# Patient Record
Sex: Male | Born: 1945 | Hispanic: Yes | Marital: Married | Smoking: Former smoker
Health system: Southern US, Community
[De-identification: ages and names within clinical notes are randomized; demographics above are authoritative.]

## PROBLEM LIST (undated history)

## (undated) DIAGNOSIS — E785 Hyperlipidemia, unspecified: Secondary | ICD-10-CM

## (undated) DIAGNOSIS — C801 Malignant (primary) neoplasm, unspecified: Secondary | ICD-10-CM

## (undated) DIAGNOSIS — J189 Pneumonia, unspecified organism: Secondary | ICD-10-CM

## (undated) DIAGNOSIS — R972 Elevated prostate specific antigen [PSA]: Secondary | ICD-10-CM

## (undated) DIAGNOSIS — I10 Essential (primary) hypertension: Secondary | ICD-10-CM

## (undated) HISTORY — PX: PROSTATE BIOPSY: SHX241

## (undated) HISTORY — PX: HERNIA REPAIR: SHX51

## (undated) HISTORY — DX: Hyperlipidemia, unspecified: E78.5

## (undated) HISTORY — DX: Elevated prostate specific antigen (PSA): R97.20

## (undated) HISTORY — DX: Essential (primary) hypertension: I10

---

## 2006-04-21 DIAGNOSIS — I10 Essential (primary) hypertension: Secondary | ICD-10-CM

## 2006-04-21 HISTORY — DX: Essential (primary) hypertension: I10

## 2007-01-18 ENCOUNTER — Ambulatory Visit: Payer: Self-pay | Admitting: Internal Medicine

## 2007-01-18 LAB — CONVERTED CEMR LAB
ALT: 31 units/L (ref 0–53)
AST: 18 units/L (ref 0–37)
Alkaline Phosphatase: 74 units/L (ref 39–117)
Basophils Absolute: 0 10*3/uL (ref 0.0–0.1)
Basophils Relative: 0 % (ref 0–1)
Chloride: 104 meq/L (ref 96–112)
Creatinine, Ser: 0.99 mg/dL (ref 0.40–1.50)
Eosinophils Absolute: 0.2 10*3/uL (ref 0.0–0.7)
Eosinophils Relative: 3 % (ref 0–5)
Hemoglobin: 16.3 g/dL (ref 13.0–17.0)
MCHC: 33 g/dL (ref 30.0–36.0)
Monocytes Absolute: 0.6 10*3/uL (ref 0.2–0.7)
Neutro Abs: 4.9 10*3/uL (ref 1.7–7.7)
PSA: 2.86 ng/mL (ref 0.10–4.00)
RDW: 14.1 % — ABNORMAL HIGH (ref 11.5–14.0)
Total Bilirubin: 0.6 mg/dL (ref 0.3–1.2)
Total CHOL/HDL Ratio: 4.2
VLDL: 48 mg/dL — ABNORMAL HIGH (ref 0–40)

## 2007-02-15 ENCOUNTER — Ambulatory Visit: Payer: Self-pay | Admitting: Internal Medicine

## 2007-02-16 ENCOUNTER — Ambulatory Visit: Payer: Self-pay | Admitting: *Deleted

## 2007-04-05 ENCOUNTER — Ambulatory Visit: Payer: Self-pay | Admitting: Family Medicine

## 2007-04-05 ENCOUNTER — Encounter (INDEPENDENT_AMBULATORY_CARE_PROVIDER_SITE_OTHER): Payer: Self-pay | Admitting: Internal Medicine

## 2007-04-05 LAB — CONVERTED CEMR LAB
ALT: 22 units/L (ref 0–53)
Alkaline Phosphatase: 74 units/L (ref 39–117)
Creatinine, Ser: 0.96 mg/dL (ref 0.40–1.50)
LDL Cholesterol: 116 mg/dL — ABNORMAL HIGH (ref 0–99)
Sodium: 141 meq/L (ref 135–145)
Total Bilirubin: 0.9 mg/dL (ref 0.3–1.2)
Total CHOL/HDL Ratio: 4.1
Total Protein: 7.2 g/dL (ref 6.0–8.3)
Triglycerides: 138 mg/dL (ref <150)
VLDL: 28 mg/dL (ref 0–40)

## 2007-04-12 ENCOUNTER — Ambulatory Visit: Payer: Self-pay | Admitting: Family Medicine

## 2016-04-21 DIAGNOSIS — E785 Hyperlipidemia, unspecified: Secondary | ICD-10-CM

## 2016-04-21 HISTORY — DX: Hyperlipidemia, unspecified: E78.5

## 2017-02-05 ENCOUNTER — Encounter: Payer: Self-pay | Admitting: Pediatric Intensive Care

## 2017-02-27 NOTE — Congregational Nurse Program (Signed)
Congregational Nurse Program Note  Date of Encounter: 02/05/2017  Past Medical History: No past medical history on file.  Encounter Details: CNP Questionnaire - 02/05/17 1600      Questionnaire   Patient Status  Immigrant    Race  Hispanic or Latino    Location Patient Served At  [facility]  Not Applicable    Uninsured  Uninsured (NEW 1x/quarter)    Food  No food insecurities    Housing/Utilities  Yes, have permanent housing    Transportation  Yes, need transportation assistance    Interpersonal Safety  Yes, feel physically and emotionally safe where you currently live    Medication  No medication insecurities    Medical Provider  No    Referrals  Primary Care Provider/Clinic;Other;Medication Assistance    ED Visit Averted  Yes    Life-Saving Intervention Made  Not Applicable      Via interpreter [name]- client states history of hypertension and took medication several years ago but has not been to a medical provider in years because he states he feels good. Denies headache or blurred vision. CN notified medical director Tia Masker MD. CN obtained Prinivil 20mg /12.5mg  qday for client and explained how to take medication. Client states understanding. Client will follow up in CN clinic next week for BP re-check. CN will make appointment at [facility] clinic for client.

## 2017-03-10 ENCOUNTER — Encounter: Payer: Self-pay | Admitting: Pediatric Intensive Care

## 2017-03-10 NOTE — Congregational Nurse Program (Signed)
Congregational Nurse Program Note  Date of Encounter: 03/10/2017  Past Medical History: No past medical history on file.  Encounter Details: CNP Questionnaire - 03/10/17 1430      Questionnaire   Patient Status  Immigrant    Race  Hispanic or Latino    Location Patient Served At  [facility]  Not Applicable    Uninsured  Uninsured (Subsequent visits/quarter)    Food  No food insecurities    Housing/Utilities  Yes, have permanent housing    Transportation  Within past 12 months, lack of transportation negatively impacted life;Yes, need transportation assistance    Interpersonal Safety  Yes, feel physically and emotionally safe where you currently live    Medication  Yes, have medication insecurities;Provided medication assistance    Medical Provider  No    Referrals  Primary Care Provider/Clinic;Medication Assistance    ED Visit Averted  Not Applicable    Life-Saving Intervention Made  Not Applicable      BP follow up. Via interpreter Beverlee Nims, client states that he feels much better since he started taking medication again. He takes his medication daily with breakfast. He does not add extra salt to foods and says his daughter (who cooks meals) does not heavily salt food. CN stated that she has not been able to find a PCP appointment but should will call [facility] clinic on Monday. Client given refill for Prinivil. Client will return to clinic for BP check.

## 2017-03-24 NOTE — Congregational Nurse Program (Signed)
Congregational Nurse Program Note  Date of Encounter: 03/10/2017  Past Medical History: No past medical history on file.  Encounter Details: CNP Questionnaire - 03/10/17 1645      Questionnaire   Patient Status  Immigrant    Race  Hispanic or Latino    Location Patient Served At  [facility]  Not Applicable    Uninsured  Uninsured (Subsequent visits/quarter)    Food  No food insecurities    Housing/Utilities  Yes, have permanent housing    Transportation  Within past 12 months, lack of transportation negatively impacted life    Interpersonal Safety  Yes, feel physically and emotionally safe where you currently live    Medication  Yes, have medication insecurities    Medical Provider  No    Referrals  Primary Care Provider/Clinic    ED Visit Averted  Not Applicable    Life-Saving Intervention Made  Not Applicable      Call to client to discuss PCp appointment at [facility] on 11/8.

## 2017-04-28 ENCOUNTER — Encounter: Payer: Self-pay | Admitting: Internal Medicine

## 2017-04-28 ENCOUNTER — Ambulatory Visit: Payer: Self-pay | Admitting: Internal Medicine

## 2017-04-28 VITALS — BP 170/90 | HR 82 | Resp 14 | Ht 66.25 in | Wt 187.0 lb

## 2017-04-28 DIAGNOSIS — K029 Dental caries, unspecified: Secondary | ICD-10-CM

## 2017-04-28 DIAGNOSIS — I10 Essential (primary) hypertension: Secondary | ICD-10-CM

## 2017-04-28 DIAGNOSIS — E663 Overweight: Secondary | ICD-10-CM | POA: Insufficient documentation

## 2017-04-28 MED ORDER — LISINOPRIL-HYDROCHLOROTHIAZIDE 20-12.5 MG PO TABS: 1.0000 | ORAL_TABLET | Freq: Every day | ORAL | 11 refills | Status: DC

## 2017-04-28 NOTE — Progress Notes (Signed)
LCSW completed new patient screening with pt. Pt reported that he had almost no stress or current stressors. He was accompanied by his daughter, who he lives with. They reported that they do not have issues with any social determinants of health. LCSW scheduled pt for an [employer].

## 2017-04-28 NOTE — Patient Instructions (Signed)

## 2017-04-28 NOTE — Progress Notes (Signed)
   Subjective:    Patient ID: [name], male    DOB: 02-14-1946, 72 y.o.   MRN: [id number]  HPI   Here to establish  1.  Essential Hypertension:  Has had diagnosis for more than 10 years.  Has been well controlled with Lisinopril HCTZ 20/12.5 mg daily. Has been out of med for 2-3 months. About 10 years ago, he was followed at [facility].  Sounds like he was without medication for 9 years until obtained with Congregational Nurse at [employer]. No labs done in past 10 years. He walks 30 minutes daily.    Diet:    Breakfast:  Coffee with milk.  Cheese sandwich with cheese  Lunch: rice and beans.  Some sort of meat.  Possibly with vegetables.  Dinner:  Banana or slice of white bread.  Drinks mainly water throughout the day.  Fruit juice.  1 soda per week.    No outpatient medications have been marked as taking for the 04/28/17 encounter (Office Visit) with Mack Hook, MD.    No Known Allergies   Past Medical History:  Diagnosis Date  . Hypertension 2008    History reviewed. No pertinent surgical history.   Family History  Problem Relation Age of Onset  . Alcohol abuse Brother   . Cirrhosis Brother        cause of death    Social History   Socioeconomic History  . Marital status: Divorced    Spouse name: Not on file  . Number of children: 3  . Years of education: 22 + 1 year college  . Highest education level: Not on file  Social Needs  . Financial resource strain: Not on file  . Food insecurity - worry: Never true  . Food insecurity - inability: Never true  . Transportation needs - medical: No  . Transportation needs - non-medical: No  Occupational History  . Not on file  Tobacco Use  . Smoking status: Never Smoker  . Smokeless tobacco: Never Used  Substance and Sexual Activity  . Alcohol use: Yes    Alcohol/week: 0.6 oz    Types: 1 Cans of beer per week    Comment: occasionally  . Drug use: No  . Sexual activity: Not on file  Other  Topics Concern  . Not on file  Social History Narrative  . Not on file    Review of Systems     Objective:   Physical Exam  NAD HEENT: PERRL, EOMI, Discs sharp, TMs pearly gray, throat without injection.  Gingiva receding significantly with periodontal disease.  + dental decay. Neck:  Supple, No adenopathy, no thyromegaly Chest:  CTA CV:  RRR with normal S1 and S2, No S3, S4 or murmur.  No carotid bruits.  Carotid, radial , DP pulse normal and equal Abd:  S, NT, No HSM or mass, + BS LE:  No edema.        Assessment & Plan:  1.  Essential Hypertension:  Refill Lisinopril/HCTZ.  Labs in 1 week  2.  Overweight:  Fasting labs in next week:  FLP, CMP, CBC.  Discussed at length lifestyle changes for diet and physical activity.  3.  Possible left inguinal hernia:  Follow up with CPE.  Patient brings this up at end of visit. Has had for years.  4.  Dental Decay:  Dental referral when he obtains orange card.

## 2017-05-05 ENCOUNTER — Other Ambulatory Visit: Payer: Self-pay

## 2017-05-05 DIAGNOSIS — E663 Overweight: Secondary | ICD-10-CM

## 2017-05-05 DIAGNOSIS — Z13220 Encounter for screening for lipoid disorders: Secondary | ICD-10-CM

## 2017-05-05 DIAGNOSIS — Z23 Encounter for immunization: Secondary | ICD-10-CM

## 2017-05-06 LAB — CBC WITH DIFFERENTIAL/PLATELET
BASOS ABS: 0 10*3/uL (ref 0.0–0.2)
Basos: 0 %
EOS (ABSOLUTE): 0.3 10*3/uL (ref 0.0–0.4)
EOS: 4 %
HEMATOCRIT: 45.8 % (ref 37.5–51.0)
HEMOGLOBIN: 16.2 g/dL (ref 13.0–17.7)
Immature Grans (Abs): 0 10*3/uL (ref 0.0–0.1)
Immature Granulocytes: 0 %
LYMPHS ABS: 1.4 10*3/uL (ref 0.7–3.1)
Lymphs: 23 %
MCH: 30.6 pg (ref 26.6–33.0)
MCHC: 35.4 g/dL (ref 31.5–35.7)
MCV: 86 fL (ref 79–97)
MONOS ABS: 0.5 10*3/uL (ref 0.1–0.9)
Monocytes: 9 %
NEUTROS ABS: 3.9 10*3/uL (ref 1.4–7.0)
Neutrophils: 64 %
Platelets: 250 10*3/uL (ref 150–379)
RBC: 5.3 x10E6/uL (ref 4.14–5.80)
RDW: 14.4 % (ref 12.3–15.4)
WBC: 6.1 10*3/uL (ref 3.4–10.8)

## 2017-05-06 LAB — COMPREHENSIVE METABOLIC PANEL
ALK PHOS: 83 IU/L (ref 39–117)
ALT: 26 IU/L (ref 0–44)
AST: 17 IU/L (ref 0–40)
Albumin/Globulin Ratio: 1.9 (ref 1.2–2.2)
Albumin: 4.7 g/dL (ref 3.6–4.8)
BILIRUBIN TOTAL: 0.7 mg/dL (ref 0.0–1.2)
BUN/Creatinine Ratio: 13 (ref 10–24)
BUN: 13 mg/dL (ref 8–27)
CHLORIDE: 103 mmol/L (ref 96–106)
CO2: 24 mmol/L (ref 20–29)
Calcium: 9.9 mg/dL (ref 8.6–10.2)
Creatinine, Ser: 1 mg/dL (ref 0.76–1.27)
GFR calc Af Amer: 88 mL/min/{1.73_m2} (ref >59)
GFR calc non Af Amer: 76 mL/min/{1.73_m2} (ref >59)
Globulin, Total: 2.5 g/dL (ref 1.5–4.5)
Glucose: 95 mg/dL (ref 65–99)
POTASSIUM: 4.5 mmol/L (ref 3.5–5.2)
SODIUM: 143 mmol/L (ref 134–144)
Total Protein: 7.2 g/dL (ref 6.0–8.5)

## 2017-05-06 LAB — LIPID PANEL W/O CHOL/HDL RATIO
CHOLESTEROL TOTAL: 192 mg/dL (ref 100–199)
HDL: 43 mg/dL (ref >39)
LDL Calculated: 127 mg/dL — ABNORMAL HIGH (ref 0–99)
TRIGLYCERIDES: 109 mg/dL (ref 0–149)
VLDL Cholesterol Cal: 22 mg/dL (ref 5–40)

## 2017-05-07 ENCOUNTER — Telehealth: Payer: Self-pay | Admitting: Internal Medicine

## 2017-05-07 NOTE — Telephone Encounter (Signed)
Antony Madura called patient and LVM for patient to call back

## 2017-05-07 NOTE — Telephone Encounter (Signed)
Awesome!

## 2017-05-07 NOTE — Telephone Encounter (Signed)
Notify patient his blood cell counts are normal, kidney and liver function normal His glucose was fine. His cholesterol total is high normal and his good portion is a bit low while the bad portion is a bit high. Needs to work on diet and physical activity as we discussed to get his cholesterol panel in a good way.

## 2017-05-07 NOTE — Telephone Encounter (Signed)
Patient returned call and information was given on 05/07/17. Patient verbally agreed and stated is going to run two times a week with his Grandson; and is eating more vegetables and fruits.  Patient also stated is avoiding fried food as well.

## 2017-06-29 ENCOUNTER — Encounter: Payer: Self-pay | Admitting: Internal Medicine

## 2017-06-29 ENCOUNTER — Ambulatory Visit: Payer: Self-pay | Admitting: Internal Medicine

## 2017-06-29 VITALS — BP 122/82 | HR 62 | Resp 12 | Ht 66.25 in | Wt 185.0 lb

## 2017-06-29 DIAGNOSIS — E663 Overweight: Secondary | ICD-10-CM

## 2017-06-29 DIAGNOSIS — K029 Dental caries, unspecified: Secondary | ICD-10-CM

## 2017-06-29 DIAGNOSIS — T464X5A Adverse effect of angiotensin-converting-enzyme inhibitors, initial encounter: Secondary | ICD-10-CM

## 2017-06-29 DIAGNOSIS — K4090 Unilateral inguinal hernia, without obstruction or gangrene, not specified as recurrent: Secondary | ICD-10-CM | POA: Insufficient documentation

## 2017-06-29 DIAGNOSIS — R05 Cough: Secondary | ICD-10-CM

## 2017-06-29 DIAGNOSIS — Z0000 Encounter for general adult medical examination without abnormal findings: Secondary | ICD-10-CM

## 2017-06-29 DIAGNOSIS — B352 Tinea manuum: Secondary | ICD-10-CM

## 2017-06-29 DIAGNOSIS — I10 Essential (primary) hypertension: Secondary | ICD-10-CM

## 2017-06-29 DIAGNOSIS — E785 Hyperlipidemia, unspecified: Secondary | ICD-10-CM

## 2017-06-29 DIAGNOSIS — B353 Tinea pedis: Secondary | ICD-10-CM

## 2017-06-29 DIAGNOSIS — Z125 Encounter for screening for malignant neoplasm of prostate: Secondary | ICD-10-CM

## 2017-06-29 MED ORDER — LOSARTAN POTASSIUM-HCTZ 50-12.5 MG PO TABS: 1.0000 | ORAL_TABLET | Freq: Every day | ORAL | 11 refills | Status: DC

## 2017-06-29 NOTE — Progress Notes (Signed)
Subjective:    Patient ID: [name], male    DOB: 1945-10-14, 72 y.o.   MRN: [id number]  HPI   Interpreted  Here for Male CPE:  1.  STE:  Does not perform.  No family history of testicular cancer.  2.  PSA/DRE: PSA tested 10 years ago and normal.  DRE also performed 10 years ago as well, also normal.  No family history of prostate cancer.  3.  Guaiac Cards:  Never.  No family history of colon cancer  4.  Colonoscopy:  Never.  As above.  5.  Cholesterol/Glucose:  Cholesterol high normal with mildly low HDL in January. Lipid Panel     Component Value Date/Time   CHOL 192 05/05/2017 0915   TRIG 109 05/05/2017 0915   HDL 43 05/05/2017 0915   CHOLHDL 4.1 Ratio 04/05/2007 2037   VLDL 28 04/05/2007 2037   LDLCALC 127 (H) 05/05/2017 0915   Fasting glucose in January normal at 95.  6.  Immunizations:  Did not get influenza vaccine this year. Immunization History  Administered Date(s) Administered  . Pneumococcal Polysaccharide-23 05/05/2017  . Tdap 05/05/2017   7.  Essential Hypertension:  Has had a cough at night since restarting Lisinopril/HCTZ.  Dry cough.  BP is well controlled, however.  Current Meds  Medication Sig  . lisinopril-hydrochlorothiazide (PRINZIDE,ZESTORETIC) 20-12.5 MG tablet Take 1 tablet by mouth daily.    No Known Allergies   Past Medical History:  Diagnosis Date  . Dyslipidemia 2018  . Hypertension 2008    History reviewed. No pertinent surgical history.   Family History  Problem Relation Age of Onset  . Hypertension Sister   . Hypertension Brother   . Hypertension Sister   . Hypertension Sister   . Stroke Brother   . Hypertension Brother   . Alcohol abuse Brother   . Cirrhosis Brother        Cause of death  . Hypertension Brother   . Hypertension Sister   . Hypertension Brother     Social History   Socioeconomic History  . Marital status: Divorced    Spouse name: Not on file  . Number of children: 3  . Years of  education: 59 + 1 year college  . Highest education level: Not on file  Social Needs  . Financial resource strain: Not on file  . Food insecurity - worry: Never true  . Food insecurity - inability: Never true  . Transportation needs - medical: No  . Transportation needs - non-medical: No  Occupational History  . Occupation: Maintenance/custodial work in past  Tobacco Use  . Smoking status: Never Smoker  . Smokeless tobacco: Never Used  Substance and Sexual Activity  . Alcohol use: Yes    Alcohol/week: 0.6 oz    Types: 1 Cans of beer per week    Comment: occasionally  . Drug use: No  . Sexual activity: Not on file  Other Topics Concern  . Not on file  Social History Narrative   From [place]   Lives with daughter, Michelene Heady and family    Review of Systems  Constitutional: Negative for appetite change, fever and unexpected weight change.  HENT: Positive for dental problem (has a hole in one of his teeth.  Would like dental referral). Negative for ear pain, hearing loss, sinus pain and sore throat.   Eyes: Positive for visual disturbance (bifocals.).  Respiratory: Positive for cough (dry--since restarting ACE I).   Cardiovascular: Negative for chest pain, palpitations and  leg swelling.  Gastrointestinal: Negative for abdominal pain, blood in stool, constipation and diarrhea.  Genitourinary: Negative for decreased urine volume, dysuria and frequency.       Has what he believes is a hernia in the right groin area.  Has had for 1 year.  Has enlarged with time.  He feels is still reducible.  Musculoskeletal: Negative for arthralgias.  Skin: Negative for rash.  Neurological: Negative for seizures, weakness and numbness.  Psychiatric/Behavioral: Negative for dysphoric mood.       Objective:   Physical Exam  Constitutional: He is oriented to person, place, and time. He appears well-developed and well-nourished.  HENT:  Head: Normocephalic and atraumatic.  Right Ear: Hearing, tympanic  membrane, external ear and ear canal normal.  Left Ear: Hearing, tympanic membrane, external ear and ear canal normal.  Nose: Nose normal.  Mouth/Throat: Oropharynx is clear and moist and mucous membranes are normal. Dental caries (Significant decay, gingival recession) present.  Eyes: Conjunctivae and EOM are normal. Pupils are equal, round, and reactive to light.  Discs sharp  Neck: Normal range of motion and full passive range of motion without pain. Neck supple. No thyromegaly present.  Cardiovascular: Normal rate, regular rhythm, S1 normal and S2 normal. Exam reveals no S3, no S4 and no friction rub.  No murmur heard. No carotid bruits.  Carotid, radial, femoral, DP and PT pulses normal and equal.   Pulmonary/Chest: Effort normal and breath sounds normal.  Abdominal: Soft. Bowel sounds are normal. He exhibits no mass. There is no hepatosplenomegaly. There is no tenderness. A hernia is present. Hernia confirmed positive in the left inguinal area (Large opening in left groin.  Easily reducible).  Genitourinary: Prostate normal and penis normal. Rectal exam shows no mass, no tenderness and guaiac negative stool. Right testis shows no mass, no swelling and no tenderness. Right testis is descended. Left testis shows no mass, no swelling and no tenderness. Left testis is descended. No penile tenderness.  Musculoskeletal: Normal range of motion. He exhibits no edema.  Lymphadenopathy:       Head (right side): No submental and no submandibular adenopathy present.       Head (left side): No submental and no submandibular adenopathy present.    He has no cervical adenopathy.    He has no axillary adenopathy.       Right: No inguinal and no supraclavicular adenopathy present.       Left: No inguinal and no supraclavicular adenopathy present.  Neurological: He is alert and oriented to person, place, and time. He has normal strength and normal reflexes. No cranial nerve deficit or sensory deficit.  Coordination and gait normal.  Skin: Skin is warm and dry. Lesion (1 cm smooth pedunculated skin colored lesion on thin neck lateral to left knee.) and rash (flaking and fissuring of right hand, palmar aspect with involvment of interdigital spaces of ulnar digits.  Similar involvment of both feet.) noted.  Psychiatric: He has a normal mood and affect. His speech is normal and behavior is normal. Judgment and thought content normal. Cognition and memory are normal.          Assessment & Plan:  1.  CPE:   PSA Guaiac cards, to return in 2 weeks Had rest of labs performed in January.  2.  Overweight:  Encouraged lifestyle changes with both diet and physical activity.  3.  Essential Hypertension with ACE I cough:  Switch to Losartan/HCTZ 50/12.5 mg daily.  BP check in 1 month to  be certain controlled on new medication without cough.  4.  Left Inguinal Hernia:  Gen Surgery vs Urology referral--wherever he can get in first.  [place]  5.  Dental Decay:  Another attempt to get him into dental clinic.  Currently a delay.  6.  Tinea manus (R), and bilateral tinea pedis:  Topical Terbinafine twice daily for at least 14 days for all areas, or until resolves. Tea Tree oil mixed with [place] foot cream twice daily as well to feet/callusing.

## 2017-06-30 LAB — PSA: Prostate Specific Ag, Serum: 6.7 ng/mL — ABNORMAL HIGH (ref 0.0–4.0)

## 2017-07-06 ENCOUNTER — Other Ambulatory Visit: Payer: Self-pay

## 2017-07-06 ENCOUNTER — Other Ambulatory Visit: Payer: Self-pay | Admitting: Internal Medicine

## 2017-07-06 DIAGNOSIS — K4090 Unilateral inguinal hernia, without obstruction or gangrene, not specified as recurrent: Secondary | ICD-10-CM

## 2017-07-06 DIAGNOSIS — R972 Elevated prostate specific antigen [PSA]: Secondary | ICD-10-CM

## 2017-07-06 NOTE — Progress Notes (Signed)
Switching to Urology for evaluation for hernia repair as needs urologic work up for elevated PSA

## 2017-07-14 ENCOUNTER — Other Ambulatory Visit (INDEPENDENT_AMBULATORY_CARE_PROVIDER_SITE_OTHER): Payer: Self-pay | Admitting: Internal Medicine

## 2017-07-14 DIAGNOSIS — Z1211 Encounter for screening for malignant neoplasm of colon: Secondary | ICD-10-CM

## 2017-07-14 LAB — POC HEMOCCULT BLD/STL (HOME/3-CARD/SCREEN)
FECAL OCCULT BLD: NEGATIVE
FECAL OCCULT BLD: NEGATIVE
FECAL OCCULT BLD: NEGATIVE

## 2017-07-27 ENCOUNTER — Other Ambulatory Visit: Payer: Self-pay

## 2017-07-27 VITALS — BP 112/78 | HR 72

## 2017-07-27 DIAGNOSIS — I10 Essential (primary) hypertension: Secondary | ICD-10-CM

## 2017-07-27 DIAGNOSIS — Z79899 Other long term (current) drug therapy: Secondary | ICD-10-CM

## 2017-07-27 NOTE — Progress Notes (Signed)
Patient BP in normal range. Informed patient to continue BP medication. Patient verbalized understanding.

## 2017-07-28 LAB — BASIC METABOLIC PANEL
BUN/Creatinine Ratio: 11 (ref 10–24)
BUN: 12 mg/dL (ref 8–27)
CO2: 27 mmol/L (ref 20–29)
Calcium: 10.3 mg/dL — ABNORMAL HIGH (ref 8.6–10.2)
Chloride: 103 mmol/L (ref 96–106)
Creatinine, Ser: 1.07 mg/dL (ref 0.76–1.27)
GFR calc Af Amer: 81 mL/min/{1.73_m2} (ref >59)
GFR calc non Af Amer: 70 mL/min/{1.73_m2} (ref >59)
Glucose: 101 mg/dL — ABNORMAL HIGH (ref 65–99)
Potassium: 4.7 mmol/L (ref 3.5–5.2)
Sodium: 145 mmol/L — ABNORMAL HIGH (ref 134–144)

## 2017-11-14 ENCOUNTER — Encounter (HOSPITAL_COMMUNITY): Payer: Self-pay | Admitting: *Deleted

## 2017-11-14 ENCOUNTER — Ambulatory Visit: Payer: Self-pay | Admitting: General Surgery

## 2017-11-14 NOTE — Progress Notes (Signed)
Denies chest pain or shortness of breath. Denies cardiologist visit. [place] interpreter number [phone] used for preop interview.

## 2017-11-17 ENCOUNTER — Encounter (HOSPITAL_COMMUNITY)
Admission: RE | Disposition: A | Discharge status: Home / Self Care | Payer: Self-pay | Source: Ambulatory Visit | Attending: General Surgery

## 2017-11-17 ENCOUNTER — Ambulatory Visit (HOSPITAL_COMMUNITY): Payer: Self-pay | Admitting: Certified Registered Nurse Anesthetist

## 2017-11-17 ENCOUNTER — Encounter (HOSPITAL_COMMUNITY): Payer: Self-pay

## 2017-11-17 ENCOUNTER — Ambulatory Visit (HOSPITAL_COMMUNITY)
Admission: RE | Admit: 2017-11-17 | Discharge: 2017-11-17 | Disposition: A | Discharge status: Home / Self Care | Payer: Self-pay | Source: Ambulatory Visit | Attending: General Surgery | Admitting: General Surgery

## 2017-11-17 DIAGNOSIS — K4020 Bilateral inguinal hernia, without obstruction or gangrene, not specified as recurrent: Secondary | ICD-10-CM | POA: Insufficient documentation

## 2017-11-17 DIAGNOSIS — Z87891 Personal history of nicotine dependence: Secondary | ICD-10-CM | POA: Insufficient documentation

## 2017-11-17 DIAGNOSIS — I10 Essential (primary) hypertension: Secondary | ICD-10-CM | POA: Insufficient documentation

## 2017-11-17 DIAGNOSIS — Z79899 Other long term (current) drug therapy: Secondary | ICD-10-CM | POA: Insufficient documentation

## 2017-11-17 HISTORY — PX: INSERTION OF MESH: SHX5868

## 2017-11-17 HISTORY — PX: INGUINAL HERNIA REPAIR: SHX194

## 2017-11-17 LAB — BASIC METABOLIC PANEL
Anion gap: 7 (ref 5–15)
BUN: 9 mg/dL (ref 8–23)
CALCIUM: 9.1 mg/dL (ref 8.9–10.3)
CO2: 25 mmol/L (ref 22–32)
Chloride: 108 mmol/L (ref 98–111)
Creatinine, Ser: 1.04 mg/dL (ref 0.61–1.24)
GFR calc Af Amer: >60 mL/min (ref >60)
GLUCOSE: 100 mg/dL — AB (ref 70–99)
POTASSIUM: 3.9 mmol/L (ref 3.5–5.1)
SODIUM: 140 mmol/L (ref 135–145)

## 2017-11-17 LAB — CBC
HCT: 46.7 % (ref 39.0–52.0)
Hemoglobin: 15.1 g/dL (ref 13.0–17.0)
MCH: 29.2 pg (ref 26.0–34.0)
MCHC: 32.3 g/dL (ref 30.0–36.0)
MCV: 90.3 fL (ref 78.0–100.0)
PLATELETS: 238 10*3/uL (ref 150–400)
RBC: 5.17 MIL/uL (ref 4.22–5.81)
RDW: 13.5 % (ref 11.5–15.5)
WBC: 5.8 10*3/uL (ref 4.0–10.5)

## 2017-11-17 SURGERY — REPAIR, HERNIA, INGUINAL, BILATERAL, LAPAROSCOPIC
Anesthesia: General | Laterality: Bilateral

## 2017-11-17 MED ORDER — ESMOLOL HCL 100 MG/10ML IV SOLN
INTRAVENOUS | Status: DC | PRN
  Administered 2017-11-17 (×3): 20 mg via INTRAVENOUS

## 2017-11-17 MED ORDER — ACETAMINOPHEN 160 MG/5ML PO SOLN: 325.0000 mg | ORAL | Status: DC | PRN

## 2017-11-17 MED ORDER — 0.9 % SODIUM CHLORIDE (POUR BTL) OPTIME
TOPICAL | Status: DC | PRN
  Administered 2017-11-17: 1000 mL

## 2017-11-17 MED ORDER — GABAPENTIN 300 MG PO CAPS
300.0000 mg | ORAL_CAPSULE | ORAL | Status: AC
  Administered 2017-11-17: 300 mg via ORAL

## 2017-11-17 MED ORDER — CELECOXIB 200 MG PO CAPS
ORAL_CAPSULE | ORAL | Status: AC
  Administered 2017-11-17: 200 mg via ORAL
  Filled 2017-11-17: qty 1

## 2017-11-17 MED ORDER — EPHEDRINE SULFATE 50 MG/ML IJ SOLN
INTRAMUSCULAR | Status: DC | PRN
  Administered 2017-11-17: 10 mg via INTRAVENOUS

## 2017-11-17 MED ORDER — OXYCODONE HCL 5 MG PO TABS: 5.0000 mg | ORAL_TABLET | Freq: Once | ORAL | Status: DC | PRN

## 2017-11-17 MED ORDER — CHLORHEXIDINE GLUCONATE CLOTH 2 % EX PADS: 6.0000 | MEDICATED_PAD | Freq: Once | CUTANEOUS | Status: DC

## 2017-11-17 MED ORDER — FENTANYL CITRATE (PF) 250 MCG/5ML IJ SOLN
INTRAMUSCULAR | Status: DC | PRN
  Administered 2017-11-17 (×5): 50 ug via INTRAVENOUS

## 2017-11-17 MED ORDER — MEPERIDINE HCL 50 MG/ML IJ SOLN: 6.2500 mg | INTRAMUSCULAR | Status: DC | PRN

## 2017-11-17 MED ORDER — DEXAMETHASONE SODIUM PHOSPHATE 10 MG/ML IJ SOLN
INTRAMUSCULAR | Status: AC
  Filled 2017-11-17: qty 1

## 2017-11-17 MED ORDER — DEXAMETHASONE SODIUM PHOSPHATE 10 MG/ML IJ SOLN
INTRAMUSCULAR | Status: DC | PRN
  Administered 2017-11-17: 5 mg via INTRAVENOUS

## 2017-11-17 MED ORDER — FENTANYL CITRATE (PF) 100 MCG/2ML IJ SOLN
INTRAMUSCULAR | Status: AC
  Filled 2017-11-17: qty 2

## 2017-11-17 MED ORDER — CEFAZOLIN SODIUM-DEXTROSE 2-4 GM/100ML-% IV SOLN
INTRAVENOUS | Status: AC
  Filled 2017-11-17: qty 100

## 2017-11-17 MED ORDER — HYDRALAZINE HCL 20 MG/ML IJ SOLN
10.0000 mg | Freq: Once | INTRAMUSCULAR | Status: AC
  Administered 2017-11-17: 10 mg via INTRAVENOUS

## 2017-11-17 MED ORDER — ONDANSETRON HCL 4 MG/2ML IJ SOLN: 4.0000 mg | Freq: Once | INTRAMUSCULAR | Status: DC | PRN

## 2017-11-17 MED ORDER — ACETAMINOPHEN 500 MG PO TABS
1000.0000 mg | ORAL_TABLET | ORAL | Status: AC
  Administered 2017-11-17: 1000 mg via ORAL

## 2017-11-17 MED ORDER — LIDOCAINE 2% (20 MG/ML) 5 ML SYRINGE
INTRAMUSCULAR | Status: AC
  Filled 2017-11-17: qty 10

## 2017-11-17 MED ORDER — FENTANYL CITRATE (PF) 100 MCG/2ML IJ SOLN: 25.0000 ug | INTRAMUSCULAR | Status: DC | PRN

## 2017-11-17 MED ORDER — ACETAMINOPHEN 500 MG PO TABS
ORAL_TABLET | ORAL | Status: AC
  Administered 2017-11-17: 1000 mg via ORAL
  Filled 2017-11-17: qty 2

## 2017-11-17 MED ORDER — MIDAZOLAM HCL 2 MG/2ML IJ SOLN
INTRAMUSCULAR | Status: DC | PRN
  Administered 2017-11-17: 2 mg via INTRAVENOUS

## 2017-11-17 MED ORDER — FENTANYL CITRATE (PF) 100 MCG/2ML IJ SOLN
25.0000 ug | INTRAMUSCULAR | Status: DC | PRN
  Administered 2017-11-17 (×2): 50 ug via INTRAVENOUS

## 2017-11-17 MED ORDER — BUPIVACAINE HCL (PF) 0.25 % IJ SOLN
INTRAMUSCULAR | Status: DC | PRN
  Administered 2017-11-17: 3 mL

## 2017-11-17 MED ORDER — CELECOXIB 200 MG PO CAPS
ORAL_CAPSULE | ORAL | Status: AC
  Filled 2017-11-17: qty 1

## 2017-11-17 MED ORDER — PROPOFOL 10 MG/ML IV BOLUS
INTRAVENOUS | Status: DC | PRN
  Administered 2017-11-17: 160 mg via INTRAVENOUS

## 2017-11-17 MED ORDER — TRAMADOL HCL 50 MG PO TABS: 50.0000 mg | ORAL_TABLET | Freq: Four times a day (QID) | ORAL | 0 refills | Status: DC | PRN

## 2017-11-17 MED ORDER — EPHEDRINE SULFATE 50 MG/ML IJ SOLN
INTRAMUSCULAR | Status: AC
  Filled 2017-11-17: qty 1

## 2017-11-17 MED ORDER — ACETAMINOPHEN 500 MG PO TABS
ORAL_TABLET | ORAL | Status: AC
  Filled 2017-11-17: qty 1

## 2017-11-17 MED ORDER — ROCURONIUM BROMIDE 10 MG/ML (PF) SYRINGE
PREFILLED_SYRINGE | INTRAVENOUS | Status: DC | PRN
  Administered 2017-11-17: 40 mg via INTRAVENOUS

## 2017-11-17 MED ORDER — OXYCODONE HCL 5 MG/5ML PO SOLN: 5.0000 mg | Freq: Once | ORAL | Status: DC | PRN

## 2017-11-17 MED ORDER — CELECOXIB 200 MG PO CAPS
200.0000 mg | ORAL_CAPSULE | ORAL | Status: AC
  Administered 2017-11-17: 200 mg via ORAL

## 2017-11-17 MED ORDER — ACETAMINOPHEN 325 MG PO TABS: 325.0000 mg | ORAL_TABLET | ORAL | Status: DC | PRN

## 2017-11-17 MED ORDER — LACTATED RINGERS IV SOLN
INTRAVENOUS | Status: DC | PRN
  Administered 2017-11-17 (×2): via INTRAVENOUS

## 2017-11-17 MED ORDER — SUGAMMADEX SODIUM 200 MG/2ML IV SOLN
INTRAVENOUS | Status: DC | PRN
  Administered 2017-11-17: 200 mg via INTRAVENOUS

## 2017-11-17 MED ORDER — PROPOFOL 10 MG/ML IV BOLUS
INTRAVENOUS | Status: AC
  Filled 2017-11-17: qty 40

## 2017-11-17 MED ORDER — ONDANSETRON HCL 4 MG/2ML IJ SOLN
INTRAMUSCULAR | Status: DC | PRN
  Administered 2017-11-17: 4 mg via INTRAVENOUS

## 2017-11-17 MED ORDER — GLYCOPYRROLATE PF 0.2 MG/ML IJ SOSY
PREFILLED_SYRINGE | INTRAMUSCULAR | Status: DC | PRN
  Administered 2017-11-17 (×2): .2 mg via INTRAVENOUS

## 2017-11-17 MED ORDER — HYDRALAZINE HCL 20 MG/ML IJ SOLN
INTRAMUSCULAR | Status: AC
  Filled 2017-11-17: qty 1

## 2017-11-17 MED ORDER — FENTANYL CITRATE (PF) 250 MCG/5ML IJ SOLN
INTRAMUSCULAR | Status: AC
  Filled 2017-11-17: qty 5

## 2017-11-17 MED ORDER — CEFAZOLIN SODIUM-DEXTROSE 2-4 GM/100ML-% IV SOLN
2.0000 g | INTRAVENOUS | Status: AC
  Administered 2017-11-17: 2 g via INTRAVENOUS
  Filled 2017-11-17: qty 100

## 2017-11-17 MED ORDER — GABAPENTIN 300 MG PO CAPS
ORAL_CAPSULE | ORAL | Status: AC
  Administered 2017-11-17: 300 mg via ORAL
  Filled 2017-11-17: qty 1

## 2017-11-17 MED ORDER — GLYCOPYRROLATE PF 0.2 MG/ML IJ SOSY
PREFILLED_SYRINGE | INTRAMUSCULAR | Status: AC
  Filled 2017-11-17: qty 3

## 2017-11-17 MED ORDER — ROCURONIUM BROMIDE 10 MG/ML (PF) SYRINGE
PREFILLED_SYRINGE | INTRAVENOUS | Status: AC
  Filled 2017-11-17: qty 10

## 2017-11-17 MED ORDER — MIDAZOLAM HCL 2 MG/2ML IJ SOLN
INTRAMUSCULAR | Status: AC
  Filled 2017-11-17: qty 2

## 2017-11-17 MED ORDER — BUPIVACAINE HCL (PF) 0.25 % IJ SOLN
INTRAMUSCULAR | Status: AC
  Filled 2017-11-17: qty 30

## 2017-11-17 MED ORDER — LIDOCAINE 2% (20 MG/ML) 5 ML SYRINGE
INTRAMUSCULAR | Status: DC | PRN
  Administered 2017-11-17: 100 mg via INTRAVENOUS

## 2017-11-17 SURGICAL SUPPLY — 40 items
APPLIER CLIP LOGIC TI 5 (MISCELLANEOUS) IMPLANT
CANISTER SUCT 3000ML PPV (MISCELLANEOUS) IMPLANT
CHLORAPREP W/TINT 26ML (MISCELLANEOUS) ×3 IMPLANT
COVER SURGICAL LIGHT HANDLE (MISCELLANEOUS) ×3 IMPLANT
DERMABOND ADVANCED (GAUZE/BANDAGES/DRESSINGS) ×6
DERMABOND ADVANCED .7 DNX12 (GAUZE/BANDAGES/DRESSINGS) ×3 IMPLANT
DISSECTOR BLUNT TIP ENDO 5MM (MISCELLANEOUS) IMPLANT
DRAPE LAPAROSCOPIC ABDOMINAL (DRAPES) ×3 IMPLANT
ELECT REM PT RETURN 9FT ADLT (ELECTROSURGICAL) ×3
ELECTRODE REM PT RTRN 9FT ADLT (ELECTROSURGICAL) ×1 IMPLANT
ENDOLOOP SUT PDS II  0 18 (SUTURE) ×2
ENDOLOOP SUT PDS II 0 18 (SUTURE) ×1 IMPLANT
GLOVE BIO SURGEON STRL SZ7.5 (GLOVE) ×3 IMPLANT
GOWN STRL REUS W/ TWL LRG LVL3 (GOWN DISPOSABLE) ×2 IMPLANT
GOWN STRL REUS W/ TWL XL LVL3 (GOWN DISPOSABLE) ×1 IMPLANT
GOWN STRL REUS W/TWL LRG LVL3 (GOWN DISPOSABLE) ×4
GOWN STRL REUS W/TWL XL LVL3 (GOWN DISPOSABLE) ×2
KIT BASIN OR (CUSTOM PROCEDURE TRAY) ×3 IMPLANT
KIT TURNOVER KIT B (KITS) ×3 IMPLANT
MESH 3DMAX 5X7 LT XLRG (Mesh General) ×3 IMPLANT
MESH 3DMAX 5X7 RT XLRG (Mesh General) ×3 IMPLANT
NEEDLE INSUFFLATION 14GA 120MM (NEEDLE) IMPLANT
NS IRRIG 1000ML POUR BTL (IV SOLUTION) ×3 IMPLANT
PAD ARMBOARD 7.5X6 YLW CONV (MISCELLANEOUS) ×6 IMPLANT
RELOAD STAPLE HERNIA 4.0 BLUE (INSTRUMENTS) ×3 IMPLANT
RELOAD STAPLE HERNIA 4.8 BLK (STAPLE) IMPLANT
SCISSORS LAP 5X35 DISP (ENDOMECHANICALS) ×3 IMPLANT
SET IRRIG TUBING LAPAROSCOPIC (IRRIGATION / IRRIGATOR) IMPLANT
SET TROCAR LAP APPLE-HUNT 5MM (ENDOMECHANICALS) ×3 IMPLANT
STAPLER HERNIA 12 8.5 360D (INSTRUMENTS) ×3 IMPLANT
SUT MNCRL AB 4-0 PS2 18 (SUTURE) ×3 IMPLANT
SUT VIC AB 1 CT1 27 (SUTURE)
SUT VIC AB 1 CT1 27XBRD ANBCTR (SUTURE) IMPLANT
TOWEL OR 17X24 6PK STRL BLUE (TOWEL DISPOSABLE) ×3 IMPLANT
TOWEL OR 17X26 10 PK STRL BLUE (TOWEL DISPOSABLE) ×3 IMPLANT
TRAY FOLEY CATH SILVER 16FR (SET/KITS/TRAYS/PACK) ×3 IMPLANT
TRAY LAPAROSCOPIC MC (CUSTOM PROCEDURE TRAY) ×3 IMPLANT
TROCAR XCEL 12X100 BLDLESS (ENDOMECHANICALS) ×3 IMPLANT
TUBING INSUFFLATION (TUBING) ×3 IMPLANT
WATER STERILE IRR 1000ML POUR (IV SOLUTION) ×3 IMPLANT

## 2017-11-17 NOTE — Op Note (Signed)
11/17/2017  2:42 PM  PATIENT:  [name]  72 y.o. male  PRE-OPERATIVE DIAGNOSIS:  Bilateral Indirect Inguinal Hernia  POST-OPERATIVE DIAGNOSIS:  Bilateral Indirect Inguinal Hernia  PROCEDURE:  Procedure(s): LAPAROSCOPIC BILATERAL INGUINAL HERNIA REPAIR (Bilateral) INSERTION OF MESH (Bilateral)  SURGEON:  Surgeon(s) and Role:    Ralene Ok, MD - Primary  ANESTHESIA:   local and general  EBL:  minimal   BLOOD ADMINISTERED:none  DRAINS: none   LOCAL MEDICATIONS USED:  BUPIVICAINE   SPECIMEN:  Source of Specimen:  none  DISPOSITION OF SPECIMEN:  N/A  COUNTS:  YES  TOURNIQUET:  * No tourniquets in log *  DICTATION: .Dragon Dictation   Counts: reported as correct x 2  Findings:  The patient had a large left and small right inguinal indirect hernias  Indications for procedure:  The patient is a 72 year old male with a  bilateral hernias for several months. Patient complained of symptomatology to his bilateral inguinal areas. The patient was taken back for elective inguinal hernia repair.  Details of the procedure: The patient was taken back to the operating room. The patient was placed in supine position with bilateral SCDs in place.  The patient was prepped and draped in the usual sterile fashion.  After appropriate anitbiotics were confirmed, a time-out was confirmed and all facts were verified.  0.25% Marcaine was used to infiltrate the umbilical area. A 11-blade was used to cut down the skin and blunt dissection was used to get the anterior fashion.  The anterior fascia was incised approximately 1 cm and the muscles were retracted laterally. Blunt dissection was then used to create a space in the preperitoneal area. At this time a 10 mm camera was then introduced into the space and advanced the pubic tubercle and a 12 mm trocar was placed over this and insufflation was started.  At this time and space was created from medial to laterally the preperitoneal  space.  Cooper's ligament was initially cleaned off.  The hernia sac was identified in the left indirect space. Dissection of the hernia sac was undertaken the vas deferens was identified and protected in all parts of the case.  There was a small tear into the hernia sac. A Veress needle right upper quadrant to help evacuate the intraperitoneal air.    Once the hernia sac was taken down to approximately the umbilicus a Bard 3D Max mesh, size: X-Large, was  introduced into the preperitoneal space.  The mesh was brought over to cover the direct and indirect hernia spaces.  This was anchored into place and secured to Cooper's ligament with 4.48mm staples from a [place] hernia stapler. It was anchored to the anterior abdominal wall with 4.8 mm staples. The hernia sac was seen lying posterior to the mesh. There was no staples placed laterally.    The same dissection took place on the right side.  There was  Small right indirect inguinal hernia seen.  The peritoneum was dissected back to the umbilicus.  A space was created laterally for the mesh.   a Bard 3D Max mesh, size: X-Large, was  introduced into the preperitoneal space.  The mesh was brought over to cover the direct and indirect hernia spaces.  This was anchored into place and secured to Cooper's ligament with 4.48mm staples from a [place] hernia stapler. It was anchored to the anterior abdominal wall with 4.8 mm staples. The hernia sac was seen lying posterior to the mesh. There was no staples placed laterally.  The insufflation was evacuated and the peritoneum was seen posterior to the mesh. The trochars were removed. The anterior fascia was reapproximated using #1 Vicryl on a UR- 6.  Intra-abdominal air was evacuated and the Veress needle removed. The skin was reapproximated using 4-0 Monocryl subcuticular fashion the patient was awakened from general anesthesia and taken to recovery in stable condition.   PLAN OF CARE: Discharge to home after  PACU  PATIENT DISPOSITION:  PACU - hemodynamically stable.   Delay start of Pharmacological VTE agent (>24hrs) due to surgical blood loss or risk of bleeding: not applicable

## 2017-11-17 NOTE — Transfer of Care (Signed)
Immediate Anesthesia Transfer of Care Note  Patient: Cole Salvo-Sanchez  Procedure(s) Performed: LAPAROSCOPIC BILATERAL INGUINAL HERNIA REPAIR (Bilateral ) INSERTION OF MESH (Bilateral )  Patient Location: PACU  Anesthesia Type:General  Level of Consciousness: awake and alert   Airway & Oxygen Therapy: Patient Spontanous Breathing and Patient connected to nasal cannula oxygen  Post-op Assessment: Report given to RN and Post -op Vital signs reviewed and stable  Post vital signs: Reviewed and stable  Last Vitals:  Vitals Value Taken Time  BP 173/103 11/17/2017  3:08 PM  Temp 36.3 C 11/17/2017  3:02 PM  Pulse 84 11/17/2017  3:08 PM  Resp 14 11/17/2017  3:08 PM  SpO2 97 % 11/17/2017  3:08 PM  Vitals shown include unvalidated device data.  Last Pain:  Vitals:   11/17/17 1502  TempSrc:   PainSc: Asleep      Patients Stated Pain Goal: 0 (47/15/95 [id number])  Complications: No apparent anesthesia complications

## 2017-11-17 NOTE — H&P (Signed)
History of Present Illness  The patient is a 72 year old male who presents with an inguinal hernia. Referred by: Dr. Consuella Lose Chief Complaint: Left inguinal hernia  Patient is a 72 year old Spanish speaking male with a history of hypertension who comes in with a two-year history of a left inguinal hernia. The patient states that the hernia is been getting larger. He states that with Valsalva he has more discomfort and pain. He states that the pain is sharp in nature. He states that the hernia fairly does not reduce on its own. Patient feels no symptoms of the right side. Patient had no previous abdominal surgeries.    Past Surgical History  No pertinent past surgical history   Allergies  No Known Allergies [10/13/2017]:  Medication History  Losartan Potassium-HCTZ (50-12.5MG  Tablet, Oral) Active. Medications Reconciled  Social History Alcohol use  Occasional alcohol use. Caffeine use  Coffee. No drug use  Tobacco use  Never smoker.  Family History Hypertension  Father, Sister.  Other Problems  High blood pressure  Inguinal Hernia  Kidney Stone     Review of Systems Ralene Ok MD; 10/13/2017 1:44 PM) General Present- Weight Gain. Not Present- Appetite Loss, Chills, Fatigue, Fever, Night Sweats and Weight Loss. Skin Not Present- Change in Wart/Mole, Dryness, Hives, Jaundice, New Lesions, Non-Healing Wounds, Rash and Ulcer. HEENT Present- Wears glasses/contact lenses. Not Present- Earache, Hearing Loss, Hoarseness, Nose Bleed, Oral Ulcers, Ringing in the Ears, Seasonal Allergies, Sinus Pain, Sore Throat, Visual Disturbances and Yellow Eyes. Respiratory Present- Snoring. Not Present- Bloody sputum, Chronic Cough, Difficulty Breathing and Wheezing. Breast Not Present- Breast Mass, Breast Pain, Nipple Discharge and Skin Changes. Cardiovascular Not Present- Chest Pain, Difficulty Breathing Lying Down, Leg Cramps, Palpitations, Rapid Heart Rate, Shortness of  Breath and Swelling of Extremities. Gastrointestinal Not Present- Abdominal Pain, Bloating, Bloody Stool, Change in Bowel Habits, Chronic diarrhea, Constipation, Difficulty Swallowing, Excessive gas, Gets full quickly at meals, Hemorrhoids, Indigestion, Nausea, Rectal Pain and Vomiting. Male Genitourinary Not Present- Blood in Urine, Change in Urinary Stream, Frequency, Impotence, Nocturia, Painful Urination, Urgency and Urine Leakage. Musculoskeletal Not Present- Back Pain, Joint Pain, Joint Stiffness, Muscle Pain, Muscle Weakness and Swelling of Extremities. Neurological Not Present- Decreased Memory, Fainting, Headaches, Numbness, Seizures, Tingling, Tremor, Trouble walking and Weakness. Endocrine Not Present- Cold Intolerance, Excessive Hunger, Hair Changes, Heat Intolerance, Hot flashes and New Diabetes. Hematology Not Present- Blood Thinners, Easy Bruising, Excessive bleeding, Gland problems, HIV and Persistent Infections. All other systems negative  BP 124/84   Pulse 60   Temp 98.2 F (36.8 C) (Oral)   Resp 18   Ht 5\' 9"  (1.753 m)   Wt 81.6 kg (180 lb)   SpO2 100%   BMI 26.58 kg/m     Physical Exam  The physical exam findings are as follows: Note:Constitutional: No acute distress, conversant, appears stated age  Eyes: Anicteric sclerae, moist conjunctiva, no lid lag  Neck: No thyromegaly, trachea midline, no cervical lymphadenopathy  Lungs: Clear to auscultation biilaterally, normal respiratory effot  Cardiovascular: regular rate & rhythm, no murmurs, no peripheal edema, pedal pulses 2+  GI: Soft, no masses or hepatosplenomegaly, non-tender to palpation  MSK: Normal gait, no clubbing cyanosis, edema  Skin: No rashes, palpation reveals normal skin turgor  Psychiatric: Appropriate judgment and insight, oriented to person, place, and time  Abdomen Inspection Hernias - Inguinal hernia - Bilateral - Reducible(Left greater than right).    Assessment & Plan   BILATERAL INGUINAL HERNIA WITHOUT OBSTRUCTION OR GANGRENE, RECURRENCE NOT SPECIFIED (  K40.20) Impression: 72 year old male with a history of hypertension left greater than right inguinal hernias.  1. The patient will like to proceed to the operating room for laparoscopic bilateral inguinal hernia repair with mesh.  2. I discussed with the patient the signs and symptoms of incarceration and strangulation and the need to proceed to the ER should they occur.  3. I discussed with the patient the risks and benefits of the procedure to include but not limited to: Infection, bleeding, damage to surrounding structures, possible need for further surgery, possible nerve pain, and possible recurrence. The patient was understanding and wishes to proceed.

## 2017-11-17 NOTE — Discharge Instructions (Signed)
[facility] _______[facility] °INGUINAL HERNIA REPAIR: POST OP INSTRUCTIONS ° °Always review your discharge instruction sheet given to you by the facility where your surgery was performed. °IF YOU HAVE DISABILITY OR FAMILY LEAVE FORMS, YOU MUST BRING THEM TO THE OFFICE FOR PROCESSING.   °DO NOT GIVE THEM TO YOUR DOCTOR. ° °1. A  prescription for pain medication may be given to you upon discharge.  Take your pain medication as prescribed, if needed.  If narcotic pain medicine is not needed, then you may take acetaminophen (Tylenol) or ibuprofen (Advil) as needed. °2. Take your usually prescribed medications unless otherwise directed. °If you need a refill on your pain medication, please contact your pharmacy.  They will contact our office to request authorization. Prescriptions will not be filled after 5 pm or on week-ends. °3. You should follow a light diet the first 24 hours after arrival home, such as soup and crackers, etc.  Be sure to include lots of fluids daily.  Resume your normal diet the day after surgery. °4.Most patients will experience some swelling and bruising around the umbilicus or in the groin and scrotum.  Ice packs and reclining will help.  Swelling and bruising can take several days to resolve.  °6. It is common to experience some constipation if taking pain medication after surgery.  Increasing fluid intake and taking a stool softener (such as Colace) will usually help or prevent this problem from occurring.  A mild laxative (Milk of Magnesia or Miralax) should be taken according to package directions if there are no bowel movements after 48 hours. °7. Unless discharge instructions indicate otherwise, you may remove your bandages 24-48 hours after surgery, and you may shower at that time.  You may have steri-strips (small skin tapes) in place directly over the incision.  These strips should be left on the skin for 7-10 days.  If your surgeon used skin glue on the incision, you may  shower in 24 hours.  The glue will flake off over the next 2-3 weeks.  Any sutures or staples will be removed at the office during your follow-up visit. °8. ACTIVITIES:  You may resume regular (light) daily activities beginning the next day--such as daily self-care, walking, climbing stairs--gradually increasing activities as tolerated.  You may have sexual intercourse when it is comfortable.  Refrain from any heavy lifting or straining until approved by your doctor. ° °a.You may drive when you are no longer taking prescription pain medication, you can comfortably wear a seatbelt, and you can safely maneuver your car and apply brakes. °b.RETURN TO WORK:   °_____________________________________________ ° °9.You should see your doctor in the office for a follow-up appointment approximately 2-3 weeks after your surgery.  Make sure that you call for this appointment within a day or two after you arrive home to insure a convenient appointment time. °10.OTHER INSTRUCTIONS: _________________________ °   _____________________________________ ° °WHEN TO CALL YOUR DOCTOR: °1. Fever over 101.0 °2. Inability to urinate °3. Nausea and/or vomiting °4. Extreme swelling or bruising °5. Continued bleeding from incision. °6. Increased pain, redness, or drainage from the incision ° °The clinic staff is available to answer your questions during regular business hours.  Please don’t hesitate to call and ask to speak to one of the nurses for clinical concerns.  If you have a medical emergency, go to the nearest emergency room or call 911.  A surgeon from [facility] is always on call at the hospital ° ° °[address], [address], [place], [place]  [address] ? ° [address] [address], , [place]   [address] °[phone] ? [phone] ? FAX [phone] °Web site: [email or web address] ° °

## 2017-11-17 NOTE — Anesthesia Procedure Notes (Signed)
Procedure Name: Intubation Date/Time: 11/17/2017 2:45 PM Performed by: Valda Favia, CRNA Pre-anesthesia Checklist: Patient identified, Emergency Drugs available, Suction available and Patient being monitored Patient Re-evaluated:Patient Re-evaluated prior to induction Oxygen Delivery Method: Circle System Utilized Preoxygenation: Pre-oxygenation with 100% oxygen Induction Type: IV induction Ventilation: Mask ventilation without difficulty Laryngoscope Size: Mac and 4 Grade View: Grade I Tube type: Oral Tube size: 7.5 mm Number of attempts: 1 Airway Equipment and Method: Stylet and Oral airway Placement Confirmation: ETT inserted through vocal cords under direct vision,  positive ETCO2 and breath sounds checked- equal and bilateral Secured at: 21 cm Tube secured with: Tape Dental Injury: Teeth and Oropharynx as per pre-operative assessment

## 2017-11-17 NOTE — Anesthesia Preprocedure Evaluation (Addendum)
Anesthesia Evaluation  Patient identified by MRN, date of birth, ID band Patient awake    Reviewed: Allergy & Precautions, H&P , NPO status , Patient's Chart, lab work & pertinent test results, reviewed documented beta blocker date and time   Airway Mallampati: II  TM Distance: >3 FB Neck ROM: full    Dental no notable dental hx.    Pulmonary former smoker,    Pulmonary exam normal breath sounds clear to auscultation       Cardiovascular Exercise Tolerance: Good hypertension, Pt. on medications  Rhythm:regular Rate:Normal     Neuro/Psych    GI/Hepatic   Endo/Other  Morbid obesity  Renal/GU      Musculoskeletal   Abdominal   Peds  Hematology   Anesthesia Other Findings   Reproductive/Obstetrics                             Anesthesia Physical Anesthesia Plan  ASA: II  Anesthesia Plan: General   Post-op Pain Management:    Induction: Intravenous  PONV Risk Score and Plan: 2 and Dexamethasone, Ondansetron and Treatment may vary due to age or medical condition  Airway Management Planned: Oral ETT  Additional Equipment:   Intra-op Plan:   Post-operative Plan: Extubation in OR  Informed Consent: I have reviewed the patients History and Physical, chart, labs and discussed the procedure including the risks, benefits and alternatives for the proposed anesthesia with the patient or authorized representative who has indicated his/her understanding and acceptance.   Dental Advisory Given  Plan Discussed with: CRNA, Surgeon and Anesthesiologist  Anesthesia Plan Comments: (  )        Anesthesia Quick Evaluation

## 2017-11-18 ENCOUNTER — Encounter (HOSPITAL_COMMUNITY): Payer: Self-pay | Admitting: General Surgery

## 2017-11-18 NOTE — Anesthesia Postprocedure Evaluation (Signed)
Anesthesia Post Note  Patient: [name], [name]  Procedure(s) Performed: LAPAROSCOPIC BILATERAL INGUINAL HERNIA REPAIR (Bilateral ) INSERTION OF MESH (Bilateral )     Patient location during evaluation: PACU Anesthesia Type: General Level of consciousness: awake and alert Pain management: pain level controlled Vital Signs Assessment: post-procedure vital signs reviewed and stable Respiratory status: spontaneous breathing, nonlabored ventilation, respiratory function stable and patient connected to nasal cannula oxygen Cardiovascular status: blood pressure returned to baseline and stable Postop Assessment: no apparent nausea or vomiting Anesthetic complications: no    Last Vitals:  Vitals:   11/17/17 1615 11/17/17 1625  BP: (!) 139/91 (!) 144/91  Pulse: 96 99  Resp: 18 18  Temp:  (!) 36.4 C  SpO2: 97% 97%    Last Pain:  Vitals:   11/17/17 1625  TempSrc:   PainSc: 4                  Esparanza Krider

## 2018-03-12 ENCOUNTER — Ambulatory Visit: Payer: Self-pay | Admitting: Internal Medicine

## 2018-03-12 ENCOUNTER — Encounter: Payer: Self-pay | Admitting: Internal Medicine

## 2018-03-12 VITALS — BP 148/98 | HR 72 | Resp 14 | Ht 66.25 in | Wt 178.0 lb

## 2018-03-12 DIAGNOSIS — B029 Zoster without complications: Secondary | ICD-10-CM

## 2018-03-12 MED ORDER — IBUPROFEN 200 MG PO TABS: ORAL_TABLET | ORAL | 0 refills | Status: DC

## 2018-03-12 MED ORDER — CAPSAICIN 0.025 % EX GEL: CUTANEOUS | 0 refills | Status: DC

## 2018-03-12 MED ORDER — ACYCLOVIR 800 MG PO TABS: ORAL_TABLET | ORAL | 0 refills | Status: DC

## 2018-03-12 NOTE — Patient Instructions (Signed)
Call if worsening redness, fever, pain. Keep lesions covered with shirt and wash your own shirts--try to avoid other handling until all lesions scabbed over.

## 2018-03-12 NOTE — Progress Notes (Signed)
   Subjective:    Patient ID: [name], male    DOB: 1945-06-21, 72 y.o.   MRN: [id number]  HPI  Broke out with painful rash/lesions on left back, axilla and chest at same level about 5-6 days ago.   He does not recall having chicken pox when a child.  BP:  Did not take medication today for some reason  Current Meds  Medication Sig  . lisinopril-hydrochlorothiazide (PRINZIDE,ZESTORETIC) 20-12.5 MG tablet Take 1 tablet by mouth daily.  . traMADol (ULTRAM) 50 MG tablet Take 1 tablet (50 mg total) by mouth every 6 (six) hours as needed.    Allergies  Allergen Reactions  . Ace Inhibitors Cough    Review of Systems     Objective:   Physical Exam  NAD Circular crops of clear vesicles on erythematous base, some older and scabbed over, some new appearing.  Start at about T4 dermatome level overlying spinous processes and extend around left axilla and onto left chest to midline, overlying sternum at midline.  Lungs:  CTA CV:  RRR without murmur or rub.  Radial pulses normal and equal       Assessment & Plan:  Herpes Zoster:  Acyclovir 800 mg 5 times daily for 7 days. Capsaicin Cream as needed topically. Keep covered with shirt around others, wash own shirts

## 2018-05-22 ENCOUNTER — Encounter: Payer: Self-pay | Admitting: Internal Medicine

## 2018-06-28 ENCOUNTER — Encounter: Payer: Self-pay | Admitting: Internal Medicine

## 2018-06-28 ENCOUNTER — Ambulatory Visit: Payer: Self-pay | Admitting: Internal Medicine

## 2018-06-28 VITALS — BP 158/80 | HR 68 | Resp 12 | Ht 66.25 in | Wt 183.0 lb

## 2018-06-28 DIAGNOSIS — K056 Periodontal disease, unspecified: Secondary | ICD-10-CM

## 2018-06-28 DIAGNOSIS — K029 Dental caries, unspecified: Secondary | ICD-10-CM

## 2018-06-28 MED ORDER — PENICILLIN V POTASSIUM 250 MG PO TABS: ORAL_TABLET | ORAL | 0 refills | Status: DC

## 2018-06-28 NOTE — Progress Notes (Signed)
    Subjective:    Patient ID: [name], male   DOB: 08-10-1945, 73 y.o.   MRN: [id number]   HPI   Daughter interprets  1.  Dental Pain:  Right lower molar area with pain.  Pain for 2 months.  No swelling or redness of surrounding gingiva.  No drainage.   Taking Ibuprofen 400 mg every 6 hours for pain with some improvement in pain.  2.  Hypertension:  Did not take his Lisinopril/HCTZ today.  States her forgot.   He does not take the medication at the same time every day.   He also does not take his medication up to twice weekly as her often feels "fine without it".  Current Meds  Medication Sig  . ibuprofen (ADVIL,MOTRIN) 200 MG tablet 2-3 tabs by mouth every 6 hours as needed for pain  . lisinopril-hydrochlorothiazide (PRINZIDE,ZESTORETIC) 20-12.5 MG tablet Take 1 tablet by mouth daily.   Allergies  Allergen Reactions  . Ace Inhibitors Cough     Review of Systems    Objective:   BP (!) 158/80 (BP Location: Left Arm, Patient Position: Sitting, Cuff Size: Normal)   Pulse 68   Resp 12   Ht 5' 6.25" (1.683 m)   Wt 183 lb (83 kg)   BMI 29.31 kg/m   Physical Exam  NAD HEENT:  PERRL, EOMI, TMs pearly gray, throat without injection. Diffuse gingival recession with underlying thickening and erythema of gingiva.  Erythema more prominent around what appears to be right lower premolar.  Missing teeth behind this tooth.  Lots of fillings and cavities. Neck:  Supple, No adenopathy, no thyromegaly Chest:  CTA CV:  RRR without murmur or rub, radial pulses normal and equal   Assessment & Plan   1.  Dental decay and infection:  penicillin 250 mg 4 times daily for 7 days.   Ibuprofen 600 mg every 6 hours as needed with food for pain. Dental referral  2.  Hypertension:  Long discussion with daughter interpreting regarding need to take medication the same time every day and never to miss and the reasoning behind this.   Not clear how important he feels this is.     Discussed using a pill box if he has difficulty remembering if he has taken the medication later in day. It is not clear why he is back on Lisinopril/HCTZ--after patient left.  He was switched to Losartan HCTZ back in march of 2019 and appears after his hernia repair surgery, was discharged back on Lisinopril HCTZ Calling his daughter to clarify whether he is having a cough again. Follow up in 4-6 weeks after taking Lisinopril/HCTZ (or Losartan HCTZ depending on what he is actually doing). daily.

## 2018-06-28 NOTE — Patient Instructions (Signed)
Take your blood pressure medicine every day in the morning with breakfast at the same time.  NEVER miss you meds!

## 2018-08-09 ENCOUNTER — Telehealth (INDEPENDENT_AMBULATORY_CARE_PROVIDER_SITE_OTHER): Payer: Self-pay | Admitting: Internal Medicine

## 2018-08-09 ENCOUNTER — Other Ambulatory Visit: Payer: Self-pay

## 2018-08-09 ENCOUNTER — Encounter: Payer: Self-pay | Admitting: Internal Medicine

## 2018-08-09 VITALS — BP 134/80 | HR 66

## 2018-08-09 DIAGNOSIS — I10 Essential (primary) hypertension: Secondary | ICD-10-CM

## 2018-08-09 DIAGNOSIS — R05 Cough: Secondary | ICD-10-CM

## 2018-08-09 DIAGNOSIS — K056 Periodontal disease, unspecified: Secondary | ICD-10-CM

## 2018-08-09 DIAGNOSIS — T464X5A Adverse effect of angiotensin-converting-enzyme inhibitors, initial encounter: Secondary | ICD-10-CM

## 2018-08-09 DIAGNOSIS — K029 Dental caries, unspecified: Secondary | ICD-10-CM

## 2018-08-09 MED ORDER — AMLODIPINE BESYLATE 2.5 MG PO TABS
2.5000 mg | ORAL_TABLET | Freq: Every day | ORAL | 11 refills | Status: DC
Start: 2018-08-09 — End: 2019-02-19

## 2018-08-09 NOTE — Progress Notes (Signed)
    Subjective:    Patient ID: [name], male   DOB: [birth year], 73 y.o.   MRN: [id number]   HPI   Virtual video via [employer]. Adult son interprets.  1.  Hypertension:  Not getting out with COVID-19 at all.  Maybe in back yard occasionally.  They do have places he can walk safely. He is taking the Lisinopril/HCTZ and not the Losartan combination.   His cough has not returned. They have been getting bps in high 130s over high 80s to 397-673 with systolic pressure since picking up the bp monitor last week.    2.  Dental Pain:  Finished the Penicillin and doing well currently.  Current Meds  Medication Sig  . lisinopril-hydrochlorothiazide (PRINZIDE,ZESTORETIC) 20-12.5 MG tablet Take 1 tablet by mouth daily.   No Active Allergies   Review of Systems    Objective:   Blood pressure 134/80, pulse 66. First BP:  140/83  Physical Exam Looks well via video  Assessment & Plan   1.  Hypertension:  Tending toward a bit high to high normal.  Add Amlodipine 2.5 mg daily To call in bps in 2 weeks. Antony Madura will call to appoint for follow up in 3 months. Get out and walk twice daily.  2.  Dental pain: resolved.  3.  ACE I cough:  Has been back on ACE I inadvertently and no cough.  Removed as medication adverse effect from allergy section.

## 2018-08-16 NOTE — Progress Notes (Signed)
Done

## 2018-09-29 ENCOUNTER — Other Ambulatory Visit: Payer: Self-pay | Admitting: Internal Medicine

## 2018-11-12 ENCOUNTER — Encounter: Payer: Self-pay | Admitting: Internal Medicine

## 2018-11-12 ENCOUNTER — Ambulatory Visit: Payer: Self-pay | Admitting: Internal Medicine

## 2018-11-12 ENCOUNTER — Other Ambulatory Visit: Payer: Self-pay

## 2018-11-12 VITALS — BP 138/84 | HR 74 | Resp 12 | Ht 66.25 in | Wt 181.0 lb

## 2018-11-12 DIAGNOSIS — R972 Elevated prostate specific antigen [PSA]: Secondary | ICD-10-CM

## 2018-11-12 DIAGNOSIS — I10 Essential (primary) hypertension: Secondary | ICD-10-CM

## 2018-11-12 DIAGNOSIS — Z79899 Other long term (current) drug therapy: Secondary | ICD-10-CM

## 2018-11-12 NOTE — Patient Instructions (Signed)
Do not restart Amlodipine for now.

## 2018-11-12 NOTE — Progress Notes (Signed)
    Subjective:    Patient ID: [name], male   DOB: October 27, 1945, 74 y.o.   MRN: [id number]   HPI   Daughter interprets  1.  Hypertension:  After taking Lisinopril/HCTZ and Amlodipine 2.5 mg for 1 month together, his blood pressure was running systolic in 263/78 and so he stopped the Amlodipine as his blood pressure was so good.  He felt well with this. He brings in BPs from home that are all in 108- 120s over 70s to 60s and these are all in July without the amlodipine.   Daughter states he has not written down higher bps she has noted--systolic in 588 range. He has not brought in his bp monitor to compare to ours. Discussion regarding treatment vs cure ensued.   2.  Elevated PSA:  Was seen last year by Urology, Dr. Consuella Lose for elevated PSA and left inguinal hernia.  Surgery for hernia repair by Dr. Rosendo Gros, [facility] surgery 11/17/2017.  Current Meds  Medication Sig  . lisinopril-hydrochlorothiazide (ZESTORETIC) 20-12.5 MG tablet Take 1 tablet by mouth once daily   No Known Allergies   Review of Systems    Objective:   BP 138/84 (BP Location: Left Arm, Patient Position: Sitting, Cuff Size: Normal)   Pulse 74   Resp 12   Ht 5' 6.25" (1.683 m)   Wt 181 lb (82.1 kg)   BMI 28.99 kg/m   Physical Exam   NAD HEENT:  PERRL, EOMI Neck:  Supple, No adenopathy, no thyromegaly Chest:  CTA CV:  RRR with normal S1 and S2, No S3, S4 or murmur.  No carotid bruits, Carotid, radial and DP pulses normal and equal LE:  No edema   Assessment & Plan  1.  Hypertension:  Hold on adding back the Amlodipine for now.  To return next week with nurse visit for comparison of his bp monitor with ours.   Discussed he needs to document and date and time all his bp checks, not just the good ones. To continue Lisinopril/HCTZ CMP, CBC  2.  History of elevated PSA.  Was seen by Dr. Consuella Lose.  Do not have that record in the chart--will check with his office to obtain records.   Reportedly, further evaluation showed his screening to be fine.  He states he was told he does not need to followup.  3.  Left inguinal hernia repair by surgery about 1 year ago, Dr. Rosendo Gros.  Doing well  4.  HM:  To call about flu vaccine clinics in September

## 2018-11-13 LAB — CBC WITH DIFFERENTIAL/PLATELET
Basophils Absolute: 0 10*3/uL (ref 0.0–0.2)
Basos: 0 %
EOS (ABSOLUTE): 0.3 10*3/uL (ref 0.0–0.4)
Eos: 5 %
Hematocrit: 47.7 % (ref 37.5–51.0)
Hemoglobin: 16.2 g/dL (ref 13.0–17.7)
Immature Grans (Abs): 0 10*3/uL (ref 0.0–0.1)
Immature Granulocytes: 0 %
Lymphocytes Absolute: 1.3 10*3/uL (ref 0.7–3.1)
Lymphs: 22 %
MCH: 30.1 pg (ref 26.6–33.0)
MCHC: 34 g/dL (ref 31.5–35.7)
MCV: 89 fL (ref 79–97)
Monocytes Absolute: 0.4 10*3/uL (ref 0.1–0.9)
Monocytes: 7 %
Neutrophils Absolute: 4.1 10*3/uL (ref 1.4–7.0)
Neutrophils: 66 %
Platelets: 221 10*3/uL (ref 150–450)
RBC: 5.39 x10E6/uL (ref 4.14–5.80)
RDW: 13.1 % (ref 11.6–15.4)
WBC: 6.1 10*3/uL (ref 3.4–10.8)

## 2018-11-13 LAB — COMPREHENSIVE METABOLIC PANEL
ALT: 21 IU/L (ref 0–44)
AST: 18 IU/L (ref 0–40)
Albumin/Globulin Ratio: 2.2 (ref 1.2–2.2)
Albumin: 4.8 g/dL (ref 3.8–4.8)
Alkaline Phosphatase: 74 IU/L (ref 39–117)
BUN/Creatinine Ratio: 14 (ref 10–24)
BUN: 15 mg/dL (ref 8–27)
Bilirubin Total: 0.7 mg/dL (ref 0.0–1.2)
CO2: 26 mmol/L (ref 20–29)
Calcium: 10.4 mg/dL — ABNORMAL HIGH (ref 8.6–10.2)
Chloride: 102 mmol/L (ref 96–106)
Creatinine, Ser: 1.04 mg/dL (ref 0.76–1.27)
GFR calc Af Amer: 84 mL/min/{1.73_m2} (ref >59)
GFR calc non Af Amer: 72 mL/min/{1.73_m2} (ref >59)
Globulin, Total: 2.2 g/dL (ref 1.5–4.5)
Glucose: 100 mg/dL — ABNORMAL HIGH (ref 65–99)
Potassium: 4.2 mmol/L (ref 3.5–5.2)
Sodium: 143 mmol/L (ref 134–144)
Total Protein: 7 g/dL (ref 6.0–8.5)

## 2018-11-14 ENCOUNTER — Encounter: Payer: Self-pay | Admitting: Internal Medicine

## 2018-11-15 ENCOUNTER — Other Ambulatory Visit: Payer: Self-pay

## 2018-11-15 ENCOUNTER — Ambulatory Visit (INDEPENDENT_AMBULATORY_CARE_PROVIDER_SITE_OTHER): Payer: Self-pay | Admitting: Internal Medicine

## 2018-11-15 DIAGNOSIS — I10 Essential (primary) hypertension: Secondary | ICD-10-CM

## 2018-11-15 NOTE — Progress Notes (Signed)
Here at my request to check his home monitor against our bp monitor. His bp is a bit higher with his home monitor. He states his bps have been running 120s/70s and is concerned about using the Amlodipine. Spoke with patient and daughter:  He will monitor bp 3 times weekly at different hours of day and document and they will drop off the bps in 1 month.  If they see his bp getting above 139/89, to call and will add back the Amlodipine.

## 2018-12-03 ENCOUNTER — Other Ambulatory Visit: Payer: Self-pay | Admitting: Internal Medicine

## 2019-02-04 ENCOUNTER — Telehealth: Payer: Self-pay | Admitting: Internal Medicine

## 2019-02-04 ENCOUNTER — Other Ambulatory Visit: Payer: Self-pay | Admitting: Internal Medicine

## 2019-02-04 NOTE — Telephone Encounter (Signed)
Patient called requesting Rx on lisinopril-hydrochlorothiazide (ZESTORETIC) 20-12.5 MG tablet to be called in at [facility].   Please advise.

## 2019-02-04 NOTE — Telephone Encounter (Signed)
rx sent to pharmacy via interface

## 2019-02-15 ENCOUNTER — Emergency Department (HOSPITAL_COMMUNITY): Payer: Medicaid Other

## 2019-02-15 ENCOUNTER — Inpatient Hospital Stay (HOSPITAL_COMMUNITY)
Admission: EM | Admit: 2019-02-15 | Discharge: 2019-02-19 | DRG: 177 | Disposition: A | Discharge status: Home / Self Care | Payer: Medicaid Other | Attending: Internal Medicine | Admitting: Internal Medicine

## 2019-02-15 ENCOUNTER — Other Ambulatory Visit: Payer: Self-pay

## 2019-02-15 ENCOUNTER — Encounter (HOSPITAL_COMMUNITY): Payer: Self-pay | Admitting: Emergency Medicine

## 2019-02-15 ENCOUNTER — Other Ambulatory Visit: Payer: Self-pay | Admitting: Registered"

## 2019-02-15 DIAGNOSIS — Z79899 Other long term (current) drug therapy: Secondary | ICD-10-CM | POA: Diagnosis not present

## 2019-02-15 DIAGNOSIS — Z20822 Contact with and (suspected) exposure to covid-19: Secondary | ICD-10-CM

## 2019-02-15 DIAGNOSIS — Z87891 Personal history of nicotine dependence: Secondary | ICD-10-CM | POA: Diagnosis not present

## 2019-02-15 DIAGNOSIS — R0902 Hypoxemia: Secondary | ICD-10-CM

## 2019-02-15 DIAGNOSIS — J9601 Acute respiratory failure with hypoxia: Secondary | ICD-10-CM | POA: Diagnosis present

## 2019-02-15 DIAGNOSIS — Z8249 Family history of ischemic heart disease and other diseases of the circulatory system: Secondary | ICD-10-CM

## 2019-02-15 DIAGNOSIS — E663 Overweight: Secondary | ICD-10-CM | POA: Diagnosis present

## 2019-02-15 DIAGNOSIS — E871 Hypo-osmolality and hyponatremia: Secondary | ICD-10-CM | POA: Diagnosis present

## 2019-02-15 DIAGNOSIS — E876 Hypokalemia: Secondary | ICD-10-CM | POA: Diagnosis present

## 2019-02-15 DIAGNOSIS — R0602 Shortness of breath: Secondary | ICD-10-CM

## 2019-02-15 DIAGNOSIS — I10 Essential (primary) hypertension: Secondary | ICD-10-CM | POA: Diagnosis present

## 2019-02-15 DIAGNOSIS — U071 COVID-19: Principal | ICD-10-CM | POA: Diagnosis present

## 2019-02-15 DIAGNOSIS — E785 Hyperlipidemia, unspecified: Secondary | ICD-10-CM | POA: Diagnosis not present

## 2019-02-15 DIAGNOSIS — Z6825 Body mass index (BMI) 25.0-25.9, adult: Secondary | ICD-10-CM | POA: Diagnosis not present

## 2019-02-15 DIAGNOSIS — J1289 Other viral pneumonia: Secondary | ICD-10-CM | POA: Diagnosis present

## 2019-02-15 DIAGNOSIS — J1282 Pneumonia due to coronavirus disease 2019: Secondary | ICD-10-CM | POA: Diagnosis present

## 2019-02-15 DIAGNOSIS — E7800 Pure hypercholesterolemia, unspecified: Secondary | ICD-10-CM | POA: Diagnosis not present

## 2019-02-15 LAB — COMPREHENSIVE METABOLIC PANEL
ALT: 117 U/L — ABNORMAL HIGH (ref 0–44)
AST: 79 U/L — ABNORMAL HIGH (ref 15–41)
Albumin: 3 g/dL — ABNORMAL LOW (ref 3.5–5.0)
Alkaline Phosphatase: 61 U/L (ref 38–126)
Anion gap: 13 (ref 5–15)
BUN: 18 mg/dL (ref 8–23)
CO2: 24 mmol/L (ref 22–32)
Calcium: 8.6 mg/dL — ABNORMAL LOW (ref 8.9–10.3)
Chloride: 92 mmol/L — ABNORMAL LOW (ref 98–111)
Creatinine, Ser: 1.27 mg/dL — ABNORMAL HIGH (ref 0.61–1.24)
GFR calc Af Amer: >60 mL/min (ref >60)
GFR calc non Af Amer: 56 mL/min — ABNORMAL LOW (ref >60)
Glucose, Bld: 132 mg/dL — ABNORMAL HIGH (ref 70–99)
Potassium: 3.4 mmol/L — ABNORMAL LOW (ref 3.5–5.1)
Sodium: 129 mmol/L — ABNORMAL LOW (ref 135–145)
Total Bilirubin: 0.9 mg/dL (ref 0.3–1.2)
Total Protein: 7 g/dL (ref 6.5–8.1)

## 2019-02-15 LAB — D-DIMER, QUANTITATIVE: D-Dimer, Quant: 1.83 ug/mL-FEU — ABNORMAL HIGH (ref 0.00–0.50)

## 2019-02-15 LAB — CBC WITH DIFFERENTIAL/PLATELET
Abs Immature Granulocytes: 0.04 10*3/uL (ref 0.00–0.07)
Basophils Absolute: 0 10*3/uL (ref 0.0–0.1)
Basophils Relative: 0 %
Eosinophils Absolute: 0 10*3/uL (ref 0.0–0.5)
Eosinophils Relative: 0 %
HCT: 44.3 % (ref 39.0–52.0)
Hemoglobin: 14.6 g/dL (ref 13.0–17.0)
Immature Granulocytes: 0 %
Lymphocytes Relative: 8 %
Lymphs Abs: 0.7 10*3/uL (ref 0.7–4.0)
MCH: 29.6 pg (ref 26.0–34.0)
MCHC: 33 g/dL (ref 30.0–36.0)
MCV: 89.9 fL (ref 80.0–100.0)
Monocytes Absolute: 0.9 10*3/uL (ref 0.1–1.0)
Monocytes Relative: 10 %
Neutro Abs: 7.3 10*3/uL (ref 1.7–7.7)
Neutrophils Relative %: 82 %
Platelets: 217 10*3/uL (ref 150–400)
RBC: 4.93 MIL/uL (ref 4.22–5.81)
RDW: 12.9 % (ref 11.5–15.5)
WBC: 8.9 10*3/uL (ref 4.0–10.5)
nRBC: 0 % (ref 0.0–0.2)

## 2019-02-15 LAB — C-REACTIVE PROTEIN: CRP: 19.6 mg/dL — ABNORMAL HIGH (ref <1.0)

## 2019-02-15 LAB — TROPONIN I (HIGH SENSITIVITY)
Troponin I (High Sensitivity): 19 ng/L — ABNORMAL HIGH (ref <18)
Troponin I (High Sensitivity): 26 ng/L — ABNORMAL HIGH (ref <18)

## 2019-02-15 LAB — FIBRINOGEN: Fibrinogen: 739 mg/dL — ABNORMAL HIGH (ref 210–475)

## 2019-02-15 LAB — ABO/RH: ABO/RH(D): O POS

## 2019-02-15 LAB — SARS CORONAVIRUS 2 BY RT PCR (HOSPITAL ORDER, PERFORMED IN ~~LOC~~ HOSPITAL LAB): SARS Coronavirus 2: POSITIVE — AB

## 2019-02-15 LAB — LACTATE DEHYDROGENASE: LDH: 381 U/L — ABNORMAL HIGH (ref 98–192)

## 2019-02-15 LAB — FERRITIN: Ferritin: 1535 ng/mL — ABNORMAL HIGH (ref 24–336)

## 2019-02-15 LAB — LACTIC ACID, PLASMA: Lactic Acid, Venous: 1.6 mmol/L (ref 0.5–1.9)

## 2019-02-15 LAB — PROCALCITONIN: Procalcitonin: 0.38 ng/mL

## 2019-02-15 LAB — HEPATITIS B SURFACE ANTIGEN: Hepatitis B Surface Ag: NONREACTIVE

## 2019-02-15 LAB — TRIGLYCERIDES: Triglycerides: 71 mg/dL (ref <150)

## 2019-02-15 MED ORDER — DEXAMETHASONE 6 MG PO TABS
6.0000 mg | ORAL_TABLET | ORAL | Status: DC
  Administered 2019-02-15 – 2019-02-18 (×4): 6 mg via ORAL
  Filled 2019-02-15 (×4): qty 1

## 2019-02-15 MED ORDER — LACTATED RINGERS IV SOLN: Freq: Once | INTRAVENOUS | Status: DC

## 2019-02-15 MED ORDER — POTASSIUM CHLORIDE CRYS ER 20 MEQ PO TBCR
40.0000 meq | EXTENDED_RELEASE_TABLET | Freq: Once | ORAL | Status: AC
  Administered 2019-02-15: 40 meq via ORAL
  Filled 2019-02-15: qty 2

## 2019-02-15 MED ORDER — SODIUM CHLORIDE 0.9 % IV SOLN
100.0000 mg | INTRAVENOUS | Status: DC
  Administered 2019-02-16 – 2019-02-18 (×3): 100 mg via INTRAVENOUS
  Filled 2019-02-15 (×4): qty 20

## 2019-02-15 MED ORDER — ACETAMINOPHEN 500 MG PO TABS
1000.0000 mg | ORAL_TABLET | Freq: Once | ORAL | Status: AC
  Administered 2019-02-15: 14:00:00 1000 mg via ORAL
  Filled 2019-02-15: qty 2

## 2019-02-15 MED ORDER — TOCILIZUMAB 400 MG/20ML IV SOLN
600.0000 mg | Freq: Once | INTRAVENOUS | Status: AC
  Administered 2019-02-15: 600 mg via INTRAVENOUS
  Filled 2019-02-15: qty 10

## 2019-02-15 MED ORDER — HYDROCHLOROTHIAZIDE 12.5 MG PO CAPS
12.5000 mg | ORAL_CAPSULE | Freq: Every day | ORAL | Status: DC
  Administered 2019-02-15 – 2019-02-19 (×5): 12.5 mg via ORAL
  Filled 2019-02-15 (×5): qty 1

## 2019-02-15 MED ORDER — PNEUMOCOCCAL VAC POLYVALENT 25 MCG/0.5ML IJ INJ
0.5000 mL | INJECTION | INTRAMUSCULAR | Status: DC
  Filled 2019-02-15: qty 0.5

## 2019-02-15 MED ORDER — ENOXAPARIN SODIUM 40 MG/0.4ML ~~LOC~~ SOLN
40.0000 mg | SUBCUTANEOUS | Status: DC
  Administered 2019-02-15: 18:00:00 40 mg via SUBCUTANEOUS
  Filled 2019-02-15: qty 0.4

## 2019-02-15 MED ORDER — SODIUM CHLORIDE 0.9 % IV SOLN
500.0000 mg | Freq: Once | INTRAVENOUS | Status: AC
  Administered 2019-02-15: 500 mg via INTRAVENOUS
  Filled 2019-02-15: qty 500

## 2019-02-15 MED ORDER — LISINOPRIL-HYDROCHLOROTHIAZIDE 20-12.5 MG PO TABS: 1.0000 | ORAL_TABLET | Freq: Every day | ORAL | Status: DC

## 2019-02-15 MED ORDER — LISINOPRIL 20 MG PO TABS
20.0000 mg | ORAL_TABLET | Freq: Every day | ORAL | Status: DC
  Administered 2019-02-15 – 2019-02-19 (×4): 20 mg via ORAL
  Filled 2019-02-15 (×5): qty 1

## 2019-02-15 MED ORDER — SODIUM CHLORIDE 0.9 % IV SOLN
1.0000 g | Freq: Once | INTRAVENOUS | Status: AC
  Administered 2019-02-15: 13:00:00 1 g via INTRAVENOUS
  Filled 2019-02-15: qty 10

## 2019-02-15 MED ORDER — SODIUM CHLORIDE 0.9 % IV SOLN
200.0000 mg | Freq: Once | INTRAVENOUS | Status: AC
  Administered 2019-02-15: 16:00:00 200 mg via INTRAVENOUS
  Filled 2019-02-15: qty 40

## 2019-02-15 MED ORDER — SODIUM CHLORIDE 0.9 % IV BOLUS
1000.0000 mL | Freq: Once | INTRAVENOUS | Status: AC
  Administered 2019-02-15: 1000 mL via INTRAVENOUS

## 2019-02-15 MED ORDER — SENNA 8.6 MG PO TABS
1.0000 | ORAL_TABLET | Freq: Two times a day (BID) | ORAL | Status: DC
  Administered 2019-02-16 – 2019-02-19 (×7): 8.6 mg via ORAL
  Filled 2019-02-15 (×7): qty 1

## 2019-02-15 MED ORDER — ACETAMINOPHEN 325 MG PO TABS
650.0000 mg | ORAL_TABLET | Freq: Four times a day (QID) | ORAL | Status: DC | PRN
  Administered 2019-02-15: 650 mg via ORAL
  Filled 2019-02-15: qty 2

## 2019-02-15 MED ORDER — DEXTROSE-NACL 5-0.45 % IV SOLN: INTRAVENOUS | Status: DC

## 2019-02-15 NOTE — ED Notes (Signed)
Ambulated pt in the room, sats dropped to 85% on room air. Placed pt on 3L [place], sats recovered to 92%

## 2019-02-15 NOTE — Progress Notes (Signed)
Wife updated

## 2019-02-15 NOTE — Progress Notes (Signed)
Pt arrived to unit. No c/o pain. VSS. On 2L nasal cannula. No needs at this time. Admission process started.

## 2019-02-15 NOTE — H&P (Signed)
History and Physical    [name] [id number] DOB: November 26, 1945 DOA: 02/15/2019  PCP: Mack Hook, MD (Confirm with patient/family/NH records and if not entered, this has to be entered at [facility] point of entry) Patient coming from: Patient coming from home  I have personally briefly reviewed patient's old medical records in [place]  Chief Complaint: Myalgias, cough, SOB  HPI: [name] is a 73 y.o. male with medical history significant of hypertension but otherwise healthy. He is presenting with fatigue, shortness of breath.  Patient states that he had a flu shot done on October 15.  He had some chills afterwards.  He states that he felt better until about 3 days ago when he started having myalgias and subjective shortness of breath.  He called his doctor and had a Covid test done this morning at [facility].  When the test was done, he was noted to be tachypneic and was recommended to come to the ER for evaluation. His oxygen saturation was around 90 to 91% on room air. No known COVID contacts.  ED Course: Pt hemodynamically stable, Temp to 101, RA O2 sat 89%, CXR with bibasilar ground glass infiltrates, Covid +, nl WBC, all inflammatory markers elevated. He is referred to [facility] for admission for Covid pneumonia.  Review of Systems: As per HPI otherwise 10 point review of systems negative.    Past Medical History:  Diagnosis Date  . Dyslipidemia 2018  . Hypertension 2008    Past Surgical History:  Procedure Laterality Date  . INGUINAL HERNIA REPAIR Bilateral 11/17/2017   Procedure: LAPAROSCOPIC BILATERAL INGUINAL HERNIA REPAIR;  Surgeon: Ralene Ok, MD;  Location: [place];  Service: General;  Laterality: Bilateral;  . INSERTION OF MESH Bilateral 11/17/2017   Procedure: INSERTION OF MESH;  Surgeon: Ralene Ok, MD;  Location: [place];  Service: General;  Laterality: Bilateral;  . PROSTATE BIOPSY     Soc Hx -  Was married, has four children and  is currently living with his daughter, son-in-law and grandchild. He works in Theatre manager. Family is support. All his immediate family has been tested for covid and know to quarantine until test results return.    reports that he has quit smoking. He has a 0.20 pack-year smoking history. He has never used smokeless tobacco. He reports current alcohol use of about 1.0 standard drinks of alcohol per week. He reports that he does not use drugs.  No Known Allergies  Family History  Problem Relation Age of Onset  . Hypertension Sister   . Hypertension Brother   . Hypertension Sister   . Hypertension Sister   . Stroke Brother   . Hypertension Brother   . Alcohol abuse Brother   . Cirrhosis Brother        Cause of death  . Hypertension Brother   . Hypertension Sister   . Hypertension Brother      Prior to Admission medications   Medication Sig Start Date End Date Taking? Authorizing Provider  amLODipine (NORVASC) 2.5 MG tablet Take 1 tablet (2.5 mg total) by mouth daily. Patient not taking: Reported on 11/12/2018 08/09/18   Mack Hook, MD  lisinopril-hydrochlorothiazide (ZESTORETIC) 20-12.5 MG tablet Take 1 tablet by mouth once daily 02/04/19   Mack Hook, MD    Physical Exam: Vitals:   02/15/19 1200 02/15/19 1245 02/15/19 1300 02/15/19 1330  BP: (!) 123/97 124/75  137/84  Pulse: 89 90  96  Resp: (!) 30 (!) 34  (!) 27  Temp:   Marland Kitchen)  101 F (38.3 C)   TempSrc:   Oral   SpO2: 90% (!) 89%  97%  Weight:      Height:        Constitutional: NAD, calm, comfortable Vitals:   02/15/19 1200 02/15/19 1245 02/15/19 1300 02/15/19 1330  BP: (!) 123/97 124/75  137/84  Pulse: 89 90  96  Resp: (!) 30 (!) 34  (!) 27  Temp:   (!) 101 F (38.3 C)   TempSrc:   Oral   SpO2: 90% (!) 89%  97%  Weight:      Height:      General appearance: WNWD man who appears healthy and is in no distress Eyes: PERRL, lids and conjunctivae normal ENMT: Mucous membranes are moist. Posterior  pharynx clear of any exudate or lesions.Normal dentition.  Neck: normal, supple, no masses, no thyromegaly Respiratory: clear to aural exam bilaterally, no wheezing, no crackles. Normal respiratory effort. No accessory muscle use.  Cardiovascular: Regular rate and rhythm, quiet precordium w/o thrill or heave. No extremity edema. 2+ pedal pulses. No carotid bruits.  Abdomen: no tenderness, no masses palpated. No hepatosplenomegaly. Bowel sounds positive.  Musculoskeletal: no clubbing / cyanosis. No joint deformity upper and lower extremities. Good ROM, no contractures. Normal muscle tone.  Skin: no rashes, lesions, ulcers. No induration Neurologic: CN 2-12 grossly intact. Sensation intact. Strength 5/5 in all 4.  Psychiatric: Normal judgment and insight. Alert and oriented x 3. Normal mood.   (Anything < 9 systems with 2 bullets each down codes to level 1) (If patient refuses exam can't bill higher level) (Make sure to document decubitus ulcers present on admission -- if possible -- and whether patient has chronic indwelling catheter at time of admission)  Labs on Admission: I have personally reviewed following labs and imaging studies  CBC: Recent Labs  Lab 02/15/19 1047  WBC 8.9  NEUTROABS 7.3  HGB 14.6  HCT 44.3  MCV 89.9  PLT [id number]   Basic Metabolic Panel: Recent Labs  Lab 02/15/19 1200  NA 129*  K 3.4*  CL 92*  CO2 24  GLUCOSE 132*  BUN 18  CREATININE 1.27*  CALCIUM 8.6*   GFR: Estimated Creatinine Clearance: 51.6 mL/min (A) (by C-G formula based on SCr of 1.27 mg/dL (H)). Liver Function Tests: Recent Labs  Lab 02/15/19 1200  AST 79*  ALT 117*  ALKPHOS 61  BILITOT 0.9  PROT 7.0  ALBUMIN 3.0*   No results for input(s): LIPASE, AMYLASE in the last 168 hours. No results for input(s): AMMONIA in the last 168 hours. Coagulation Profile: No results for input(s): INR, PROTIME in the last 168 hours. Cardiac Enzymes: No results for input(s): CKTOTAL, CKMB,  CKMBINDEX, TROPONINI in the last 168 hours. BNP (last 3 results) No results for input(s): PROBNP in the last 8760 hours. HbA1C: No results for input(s): HGBA1C in the last 72 hours. CBG: No results for input(s): GLUCAP in the last 168 hours. Lipid Profile: Recent Labs    02/15/19 1048  TRIG 71   Thyroid Function Tests: No results for input(s): TSH, T4TOTAL, FREET4, T3FREE, THYROIDAB in the last 72 hours. Anemia Panel: Recent Labs    02/15/19 1200  FERRITIN 1,535*   Urine analysis: No results found for: COLORURINE, APPEARANCEUR, LABSPEC, PHURINE, GLUCOSEU, HGBUR, BILIRUBINUR, KETONESUR, PROTEINUR, UROBILINOGEN, NITRITE, LEUKOCYTESUR  Radiological Exams on Admission: Dg Chest Port 1 View  Result Date: 02/15/2019 CLINICAL DATA:  Shortness of breath. EXAM: PORTABLE CHEST 1 VIEW COMPARISON:  None. FINDINGS: The heart size  and mediastinal contours are within normal limits. No pneumothorax is noted. Bilateral patchy airspace opacities are noted in both lungs, right greater than left, concerning for multifocal pneumonia. Small right pleural effusion may be present. The visualized skeletal structures are unremarkable. IMPRESSION: Bilateral patchy airspace opacities are noted, right greater than left, concerning for multifocal pneumonia. Small right pleural effusion may be present. Electronically Signed   By: Marijo Conception M.D.   On: 02/15/2019 11:31    EKG: Independently reviewed. Sinus Rhythm, LVH, LAE, no acute changes  Assessment/Plan Active Problems:   Pneumonia due to COVID-19 virus   Hypertension   Overweight (BMI 25.0-29.9)  (please populate well all problems here in Problem List. (For example, if patient is on BP meds at home and you resume or decide to hold them, it is a problem that needs to be her. Same for CAD, COPD, HLD and so on)   1. Covid PNA - covid + in ED, abnl CXR, inflammatory markers elevated, mild hypoxemia with RA O2 Sat 89%, febrile Plan Medical admit to  [place] consult ordered  Dexamethasone 6 mg daily  O2 at 2 liters [place]  Supportive care  2. HTN - will continue home medications.  DVT prophylaxis: lovenox (Lovenox/Heparin/SCD's/anticoagulated/None (if comfort care) Code Status: full code (Full/Partial (specify details) Family Communication: Daughter present: understands Dx and treatment plan including transfer to [facility] (Specify name, relationship. Do not write "discussed with patient". Specify tel # if discussed over the phone) Disposition Plan: Home 5-7 days (specify when and where you expect patient to be discharged) Consults called: none (with names) Admission status: inpatient (inpatient / obs / tele / medical floor / SDU)   Adella Hare MD [facility] Pager [phone]  If 7PM-7AM, please contact night-coverage [email or web address] Password [email or web address]  02/15/2019, 2:50 PM

## 2019-02-15 NOTE — ED Provider Notes (Signed)
[place] EMERGENCY DEPARTMENT Provider Note   CSN: [id number] Arrival date & time: 02/15/19  [id number]     History   Chief Complaint Chief Complaint  Patient presents with  . Fatigue    HPI Augusto Chrissie Noa is a 73 y.o. male hx of HL, HTN, presenting with fatigue, shortness of breath.  Patient states that he had a flu shot done on October 15.  He had some chills afterwards.  He states that he felt better until about 3 days ago when he started having myalgias and subjective shortness of breath.  He called his doctor and had a Covid test done this morning at [facility].  When the test was done, he was noted to be tachypneic and was recommended to come to the ER for evaluation. His oxygen saturation was around 90 to 91% on room air. No known COVID contacts.     The history is provided by the patient.    Past Medical History:  Diagnosis Date  . Dyslipidemia 2018  . Hypertension 2008    Patient Active Problem List   Diagnosis Date Noted  . Elevated PSA, less than 10 ng/ml 11/12/2018  . Dental decay 06/29/2017  . Left inguinal hernia 06/29/2017  . Overweight (BMI 25.0-29.9) 04/28/2017  . Dyslipidemia 04/21/2016  . Hypertension 04/21/2006    Past Surgical History:  Procedure Laterality Date  . INGUINAL HERNIA REPAIR Bilateral 11/17/2017   Procedure: LAPAROSCOPIC BILATERAL INGUINAL HERNIA REPAIR;  Surgeon: Ralene Ok, MD;  Location: [place];  Service: General;  Laterality: Bilateral;  . INSERTION OF MESH Bilateral 11/17/2017   Procedure: INSERTION OF MESH;  Surgeon: Ralene Ok, MD;  Location: [place];  Service: General;  Laterality: Bilateral;  . PROSTATE BIOPSY          Home Medications    Prior to Admission medications   Medication Sig Start Date End Date Taking? Authorizing Provider  amLODipine (NORVASC) 2.5 MG tablet Take 1 tablet (2.5 mg total) by mouth daily. Patient not taking: Reported on 11/12/2018 08/09/18   Mack Hook, MD   lisinopril-hydrochlorothiazide (ZESTORETIC) 20-12.5 MG tablet Take 1 tablet by mouth once daily 02/04/19   Mack Hook, MD    Family History Family History  Problem Relation Age of Onset  . Hypertension Sister   . Hypertension Brother   . Hypertension Sister   . Hypertension Sister   . Stroke Brother   . Hypertension Brother   . Alcohol abuse Brother   . Cirrhosis Brother        Cause of death  . Hypertension Brother   . Hypertension Sister   . Hypertension Brother     Social History Social History   Tobacco Use  . Smoking status: Former Smoker    Packs/day: 0.10    Years: 2.00    Pack years: 0.20  . Smokeless tobacco: Never Used  . Tobacco comment: States was > 20 years ago  Substance Use Topics  . Alcohol use: Yes    Alcohol/week: 1.0 standard drinks    Types: 1 Cans of beer per week    Comment: occasionally  . Drug use: No     Allergies   Patient has no known allergies.   Review of Systems Review of Systems  Respiratory: Positive for cough and shortness of breath.   All other systems reviewed and are negative.    Physical Exam Updated Vital Signs BP 124/75   Pulse 90   Temp (!) 101 F (38.3 C) (Oral)  Resp (!) 34   Ht 5\' 8"  (1.727 m)   Wt 81.6 kg   SpO2 (!) 89%   BMI 27.37 kg/m   Physical Exam Vitals signs and nursing note reviewed.  Constitutional:      Comments: Chronically ill   HENT:     Mouth/Throat:     Mouth: Mucous membranes are dry.  Eyes:     Extraocular Movements: Extraocular movements intact.     Pupils: Pupils are equal, round, and reactive to light.  Neck:     Musculoskeletal: Normal range of motion.  Cardiovascular:     Rate and Rhythm: Normal rate and regular rhythm.     Pulses: Normal pulses.     Heart sounds: Normal heart sounds.  Pulmonary:     Comments: Diminished bilaterally  Abdominal:     General: Abdomen is flat.     Palpations: Abdomen is soft.  Musculoskeletal: Normal range of motion.  Skin:     General: Skin is warm.     Capillary Refill: Capillary refill takes less than 2 seconds.  Neurological:     General: No focal deficit present.     Mental Status: He is alert and oriented to person, place, and time.  Psychiatric:        Mood and Affect: Mood normal.        Behavior: Behavior normal.      ED Treatments / Results  Labs (all labs ordered are listed, but only abnormal results are displayed) Labs Reviewed  SARS CORONAVIRUS 2 BY RT PCR (HOSPITAL ORDER, [place] [facility]) - Abnormal; Notable for the following components:      Result Value   SARS Coronavirus 2 POSITIVE (*)    All other components within normal limits  D-DIMER, QUANTITATIVE (NOT AT [facility]) - Abnormal; Notable for the following components:   D-Dimer, Quant 1.83 (*)    All other components within normal limits  FIBRINOGEN - Abnormal; Notable for the following components:   Fibrinogen 739 (*)    All other components within normal limits  COMPREHENSIVE METABOLIC PANEL - Abnormal; Notable for the following components:   Sodium 129 (*)    Potassium 3.4 (*)    Chloride 92 (*)    Glucose, Bld 132 (*)    Creatinine, Ser 1.27 (*)    Calcium 8.6 (*)    Albumin 3.0 (*)    AST 79 (*)    ALT 117 (*)    GFR calc non Af Amer 56 (*)    All other components within normal limits  LACTATE DEHYDROGENASE - Abnormal; Notable for the following components:   LDH 381 (*)    All other components within normal limits  TROPONIN I (HIGH SENSITIVITY) - Abnormal; Notable for the following components:   Troponin I (High Sensitivity) 19 (*)    All other components within normal limits  CULTURE, BLOOD (ROUTINE X 2)  CULTURE, BLOOD (ROUTINE X 2)  CBC WITH DIFFERENTIAL/PLATELET  LACTIC ACID, PLASMA  TRIGLYCERIDES  PROCALCITONIN  C-REACTIVE PROTEIN  FERRITIN    EKG EKG Interpretation  Date/Time:  Tuesday February 15 2019 10:45:05 EDT Ventricular Rate:  98 PR Interval:    QRS Duration: 90 QT Interval:   352 QTC Calculation: 450 R Axis:   -19 Text Interpretation: Sinus rhythm Probable left atrial enlargement Borderline left axis deviation Borderline T wave abnormalities Baseline wander in lead(s) V3 nonspecific changes since previous Confirmed by Wandra Arthurs [id number]) on 02/15/2019 10:58:18 AM   Radiology Dg  Chest Port 1 View  Result Date: 02/15/2019 CLINICAL DATA:  Shortness of breath. EXAM: PORTABLE CHEST 1 VIEW COMPARISON:  None. FINDINGS: The heart size and mediastinal contours are within normal limits. No pneumothorax is noted. Bilateral patchy airspace opacities are noted in both lungs, right greater than left, concerning for multifocal pneumonia. Small right pleural effusion may be present. The visualized skeletal structures are unremarkable. IMPRESSION: Bilateral patchy airspace opacities are noted, right greater than left, concerning for multifocal pneumonia. Small right pleural effusion may be present. Electronically Signed   By: Marijo Conception M.D.   On: 02/15/2019 11:31    Procedures Procedures (including critical care time)  CRITICAL CARE Performed by: Wandra Arthurs   Total critical care time: 30 minutes  Critical care time was exclusive of separately billable procedures and treating other patients.  Critical care was necessary to treat or prevent imminent or life-threatening deterioration.  Critical care was time spent personally by me on the following activities: development of treatment plan with patient and/or surrogate as well as nursing, discussions with consultants, evaluation of patient's response to treatment, examination of patient, obtaining history from patient or surrogate, ordering and performing treatments and interventions, ordering and review of laboratory studies, ordering and review of radiographic studies, pulse oximetry and re-evaluation of patient's condition.   Medications Ordered in ED Medications  cefTRIAXone (ROCEPHIN) 1 g in sodium chloride 0.9 %  100 mL IVPB (1 g Intravenous New Bag/Given 02/15/19 1249)  azithromycin (ZITHROMAX) 500 mg in sodium chloride 0.9 % 250 mL IVPB (has no administration in time range)  acetaminophen (TYLENOL) tablet 1,000 mg (has no administration in time range)  sodium chloride 0.9 % bolus 1,000 mL (0 mLs Intravenous Stopped 02/15/19 1300)     Initial Impression / Assessment and Plan / ED Course  I have reviewed the triage vital signs and the nursing notes.  Pertinent labs & imaging results that were available during my care of the patient were reviewed by me and considered in my medical decision making (see chart for details).       Milan Chrissie Noa is a 73 y.o. male here with SOB. O2 is around 89-90% on RA. Patient appears tachypneic. Consider COVID vs pneumonia. Will get labs, CXR.   1:13 PM Labs showed COVID positive. Inflammatory markers elevated. Temp now 101. Desat to 89% on RA with ambulation. ? Pneumonia on CXR, ordered abx. Will admit for hypoxia from COVID, possible pneumonia.   Bradford Salvo-Sanchez was evaluated in Emergency Department on 02/15/2019 for the symptoms described in the history of present illness. He was evaluated in the context of the global COVID-19 pandemic, which necessitated consideration that the patient might be at risk for infection with the SARS-CoV-2 virus that causes COVID-19. Institutional protocols and algorithms that pertain to the evaluation of patients at risk for COVID-19 are in a state of rapid change based on information released by regulatory bodies including the CDC and federal and state organizations. These policies and algorithms were followed during the patient's care in the ED.    Final Clinical Impressions(s) / ED Diagnoses   Final diagnoses:  None    ED Discharge Orders    None       Drenda Freeze, MD 02/15/19 1315

## 2019-02-15 NOTE — ED Notes (Signed)
Report given to [facility] RN, Otila Kluver. Awaiting [facility] at this time.

## 2019-02-15 NOTE — ED Notes (Signed)
[facility] called @ 1519-per RN called by Levada Dy

## 2019-02-15 NOTE — Progress Notes (Signed)
   Vital Signs MEWS/VS Documentation      02/15/2019 1934 02/15/2019 2146   MEWS Score:  3  3   MEWS Score Color:  Yellow  Yellow   Resp:  18  (!) 27   Pulse:  (!) 116  69   BP:  136/82  90/64   Temp:  (!) 100.7 F (38.2 C)  98.9 F (37.2 C)   O2 Device:  Nasal Cannula  Nasal Cannula   O2 Flow Rate (L/min):  3 L/min  2 L/min     [name] was notified about yellow MEWs. No response from covering service. Will continue to check VS Q2.       [name] L [name] 02/15/2019,10:37 PM

## 2019-02-15 NOTE — Progress Notes (Signed)
        INTERIM PROGRESS NOTE Please see H/P from earlier today for complete HPI and plan  [name]  [id number] DOB: October 12, 1945 DOA: 02/15/2019 PCP: Mack Hook, MD   Brief Narrative:  Patient admitted from [facility] given Covid positive labs, chest x-ray findings with bilateral patchy infiltrate, hypoxia.  Initiated on Remdesivir and dexamethasone at [facility].  Upon admission here patient was on D5 half-normal with hyponatremia, transition to LR 75 cc x 1 hour until p.o. intake improves.  Patient has markedly elevated CRP with borderline normal procalcitonin, as such Actemra was given over concern for overt inflammatory process been chest x-ray findings, hypoxia and fever with moderately sudden onset.  Patient remains high risk for decompensation given advanced age, comorbid conditions and concerning labs and clinical evaluation.   Plan -Discontinue normal saline --> add Lactated Ringer's at 75 cc x 1 L -Actemra x1 dose today 02/15/2019 -Currently requiring 4 L nasal cannula intake at [place] SpO2: 98 % O2 Flow Rate (L/min): 4 L/min FiO2 (%): 21 %  Recent Labs    02/15/19 1047 02/15/19 1200  DDIMER 1.83*  --   FERRITIN  --  1,535*  LDH  --  381*  CRP  --  19.6*    Lab Results  Component Value Date   SARSCOV2NAA POSITIVE (A) 02/15/2019    Time spent: 64min  [name] C [name], DO [facility]  If 7PM-7AM, please contact night-coverage [email or web address] Password [email or web address] 02/15/2019, 6:05 PM

## 2019-02-15 NOTE — ED Triage Notes (Addendum)
Pt reports that he received the flu shot October 15th and has had generalized body fatigue/weakness since. Last week pt reported nausea and fevers but none recently. Decreased appetite, loss of taste. Pt also reports orthopnea which is new. Pt is Spanish speaking.

## 2019-02-15 NOTE — Progress Notes (Signed)
Ok to give one dose of KCL 40mg  for k 3.4 per Dr. Avon Gully.  Onnie Boer, PharmD, BCIDP, AAHIVP, CPP Infectious Disease Pharmacist 02/15/2019 6:55 PM

## 2019-02-15 NOTE — ED Notes (Signed)
Secretary to call [facility] for transport. 

## 2019-02-16 DIAGNOSIS — E876 Hypokalemia: Secondary | ICD-10-CM | POA: Diagnosis present

## 2019-02-16 DIAGNOSIS — E785 Hyperlipidemia, unspecified: Secondary | ICD-10-CM | POA: Diagnosis present

## 2019-02-16 LAB — CBC WITH DIFFERENTIAL/PLATELET
Abs Immature Granulocytes: 0.06 10*3/uL (ref 0.00–0.07)
Basophils Absolute: 0 10*3/uL (ref 0.0–0.1)
Basophils Relative: 0 %
Eosinophils Absolute: 0 10*3/uL (ref 0.0–0.5)
Eosinophils Relative: 0 %
HCT: 46.2 % (ref 39.0–52.0)
Hemoglobin: 15.7 g/dL (ref 13.0–17.0)
Immature Granulocytes: 1 %
Lymphocytes Relative: 6 %
Lymphs Abs: 0.5 10*3/uL — ABNORMAL LOW (ref 0.7–4.0)
MCH: 29.5 pg (ref 26.0–34.0)
MCHC: 34 g/dL (ref 30.0–36.0)
MCV: 86.8 fL (ref 80.0–100.0)
Monocytes Absolute: 0.4 10*3/uL (ref 0.1–1.0)
Monocytes Relative: 5 %
Neutro Abs: 6.8 10*3/uL (ref 1.7–7.7)
Neutrophils Relative %: 88 %
Platelets: 250 10*3/uL (ref 150–400)
RBC: 5.32 MIL/uL (ref 4.22–5.81)
RDW: 13.2 % (ref 11.5–15.5)
WBC: 7.7 10*3/uL (ref 4.0–10.5)
nRBC: 0 % (ref 0.0–0.2)

## 2019-02-16 LAB — COMPREHENSIVE METABOLIC PANEL
ALT: 113 U/L — ABNORMAL HIGH (ref 0–44)
AST: 68 U/L — ABNORMAL HIGH (ref 15–41)
Albumin: 3 g/dL — ABNORMAL LOW (ref 3.5–5.0)
Alkaline Phosphatase: 71 U/L (ref 38–126)
Anion gap: 11 (ref 5–15)
BUN: 21 mg/dL (ref 8–23)
CO2: 25 mmol/L (ref 22–32)
Calcium: 8.9 mg/dL (ref 8.9–10.3)
Chloride: 99 mmol/L (ref 98–111)
Creatinine, Ser: 0.98 mg/dL (ref 0.61–1.24)
GFR calc Af Amer: >60 mL/min (ref >60)
GFR calc non Af Amer: >60 mL/min (ref >60)
Glucose, Bld: 214 mg/dL — ABNORMAL HIGH (ref 70–99)
Potassium: 3.4 mmol/L — ABNORMAL LOW (ref 3.5–5.1)
Sodium: 135 mmol/L (ref 135–145)
Total Bilirubin: 0.3 mg/dL (ref 0.3–1.2)
Total Protein: 6.7 g/dL (ref 6.5–8.1)

## 2019-02-16 LAB — D-DIMER, QUANTITATIVE: D-Dimer, Quant: 1.59 ug/mL-FEU — ABNORMAL HIGH (ref 0.00–0.50)

## 2019-02-16 LAB — PHOSPHORUS: Phosphorus: 2.3 mg/dL — ABNORMAL LOW (ref 2.5–4.6)

## 2019-02-16 LAB — FERRITIN: Ferritin: 2290 ng/mL — ABNORMAL HIGH (ref 24–336)

## 2019-02-16 LAB — LACTATE DEHYDROGENASE: LDH: 361 U/L — ABNORMAL HIGH (ref 98–192)

## 2019-02-16 LAB — MAGNESIUM: Magnesium: 2.6 mg/dL — ABNORMAL HIGH (ref 1.7–2.4)

## 2019-02-16 MED ORDER — IPRATROPIUM-ALBUTEROL 20-100 MCG/ACT IN AERS
1.0000 | INHALATION_SPRAY | Freq: Four times a day (QID) | RESPIRATORY_TRACT | Status: DC
  Administered 2019-02-16 – 2019-02-19 (×12): 1 via RESPIRATORY_TRACT
  Filled 2019-02-16: qty 4

## 2019-02-16 MED ORDER — POTASSIUM CHLORIDE CRYS ER 20 MEQ PO TBCR
40.0000 meq | EXTENDED_RELEASE_TABLET | Freq: Two times a day (BID) | ORAL | Status: AC
  Administered 2019-02-16 (×2): 40 meq via ORAL
  Filled 2019-02-16 (×2): qty 2

## 2019-02-16 NOTE — Progress Notes (Addendum)
MEWS/VS Documentation      02/16/2019 0705 02/16/2019 0738 02/16/2019 1543 02/16/2019 1654   MEWS Score:  1  1  1  4    MEWS Score Color:  Green  Green  Green  Red   Resp:  -  18  (!) 24  -   Pulse:  -  61  64  -   BP:  -  96/67  101/69  -   Temp:  -  97.8 F (36.6 C)  98 F (36.7 C)  -   O2 Device:  -  Nasal Cannula  Nasal Cannula  -   O2 Flow Rate (L/min):  -  3 L/min  3 L/min  -   Level of Consciousness:  -  Alert  -  -     16:54 MEWS red from HR of 140. CCMD recorded heart rate. Pt stable and asymptomatic, attempting to sit to side of bed for dinner. No other issues noted.

## 2019-02-16 NOTE — Progress Notes (Signed)
Family Update  Spoke with wife to update status/POC and review d/c planning.

## 2019-02-16 NOTE — Progress Notes (Signed)
PROGRESS NOTE    [name]  [id number] DOB: Dec 03, 1945 DOA: 02/15/2019 PCP: Mack Hook, MD   Brief Narrative:  73 y.o.  Hispanic male  (Spanish-speaking only), PMHx HTN, HLD,    presenting with fatigue, shortness of breath. Patient states that he had a flu shot done on October 15. He had some chills afterwards. He states that he felt better until about 3 days ago when he started having myalgias and subjective shortness of breath. He called his doctor and had a Covid test done this morning at [facility]. When the test was done, he was noted to be tachypneic and was recommended to come to the ER for evaluation.His oxygen saturation was around 90 to 91% on room air. No known COVID contacts.  ED Course: Pt hemodynamically stable, Temp to 101, RA O2 sat 89%, CXR with bibasilar ground glass infiltrates, Covid +, nl WBC, all inflammatory markers elevated. He is referred to [facility] for admission for Covid pneumonia.   Subjective: 10/28 A/O x4, negative CP, negative S OB.  T-max last 24 hours 38.2 C   Assessment & Plan:   Active Problems:   Hypertension   Overweight (BMI 25.0-29.9)   Pneumonia due to COVID-19 virus   Hypokalemia   HLD (hyperlipidemia)    Covid pneumonia/acute respiratory failure with hypoxia COVID-19 Labs  Recent Labs    02/15/19 1047 02/15/19 1200 02/16/19 1140  DDIMER 1.83*  --  1.59*  FERRITIN  --  1,535* 2,290*  LDH  --  381* 361*  CRP  --  19.6*  --     Lab Results  Component Value Date   SARSCOV2NAA POSITIVE (A) 02/15/2019  -Decadron 6 mg daily -Remdesivir per pharmacy protocol -10/27 Actemra x1 dose -Titrate O2 to maintain SPO2> 88% -Combivent QID -Flutter valve  Essential HTN -Lisinopril 20 mg daily -HCTZ 12.5 mg daily  HLD -Lipid panel pending  Hypokalemia -Potassium goal> 4 -Potassium p.o. 40 mg x 2 doses     DVT prophylaxis: Lovenox Code Status: Full Family Communication:  Disposition Plan: TBD    Consultants:    Procedures/Significant Events:     I have personally reviewed and interpreted all radiology studies and my findings are as above.  VENTILATOR SETTINGS:    Cultures   Antimicrobials: Anti-infectives (From admission, onward)   Start     Stop   02/16/19 1600  remdesivir 100 mg in sodium chloride 0.9 % 250 mL IVPB     02/20/19 1559   02/15/19 1445  remdesivir 200 mg in sodium chloride 0.9 % 250 mL IVPB     02/15/19 1951   02/15/19 1215  cefTRIAXone (ROCEPHIN) 1 g in sodium chloride 0.9 % 100 mL IVPB     02/15/19 1335   02/15/19 1215  azithromycin (ZITHROMAX) 500 mg in sodium chloride 0.9 % 250 mL IVPB     02/15/19 1444       Devices    LINES / TUBES:      Continuous Infusions: . remdesivir 100 mg in NS 250 mL       Objective: Vitals:   02/15/19 2146 02/16/19 0002 02/16/19 0357 02/16/19 0738  BP: 90/64 100/75 99/70 96/67   Pulse: 69 (!) 57 (!) 56 61  Resp: (!) 27 20 20 18   Temp: 98.9 F (37.2 C) 97.9 F (36.6 C) 98.4 F (36.9 C) 97.8 F (36.6 C)  TempSrc: Oral Oral Oral Axillary  SpO2: 95% 98% 93% 95%  Weight:      Height:  Intake/Output Summary (Last 24 hours) at 02/16/2019 1527 Last data filed at 02/16/2019 1208 Gross per 24 hour  Intake 840 ml  Output 1200 ml  Net -360 ml   Filed Weights   02/15/19 0925 02/15/19 1645  Weight: 81.6 kg 81.2 kg    Examination:  General: A/O x4, positive acute respiratory distress Eyes: negative scleral hemorrhage, negative anisocoria, negative icterus ENT: Negative Runny nose, negative gingival bleeding, Neck:  Negative scars, masses, torticollis, lymphadenopathy, JVD Lungs: Clear to auscultation bilaterally without wheezes or crackles Cardiovascular: Regular rate and rhythm without murmur gallop or rub normal S1 and S2 Abdomen: negative abdominal pain, nondistended, positive soft, bowel sounds, no rebound, no ascites, no appreciable mass Extremities: No significant cyanosis, clubbing,  or edema bilateral lower extremities Skin: Negative rashes, lesions, ulcers Psychiatric:  Negative depression, negative anxiety, negative fatigue, negative mania  Central nervous system:  Cranial nerves II through XII intact, tongue/uvula midline, all extremities muscle strength 5/5, sensation intact throughout,  negative dysarthria, negative expressive aphasia, negative receptive aphasia.  .     Data Reviewed: Care during the described time interval was provided by me .  I have reviewed this patient's available data, including medical history, events of note, physical examination, and all test results as part of my evaluation.   CBC: Recent Labs  Lab 02/15/19 1047 02/16/19 1140  WBC 8.9 7.7  NEUTROABS 7.3 6.8  HGB 14.6 15.7  HCT 44.3 46.2  MCV 89.9 86.8  PLT 217 [id number]   Basic Metabolic Panel: Recent Labs  Lab 02/15/19 1200 02/16/19 1140  NA 129* 135  K 3.4* 3.4*  CL 92* 99  CO2 24 25  GLUCOSE 132* 214*  BUN 18 21  CREATININE 1.27* 0.98  CALCIUM 8.6* 8.9  MG  --  2.6*  PHOS  --  2.3*   GFR: Estimated Creatinine Clearance: 66.9 mL/min (by C-G formula based on SCr of 0.98 mg/dL). Liver Function Tests: Recent Labs  Lab 02/15/19 1200 02/16/19 1140  AST 79* 68*  ALT 117* 113*  ALKPHOS 61 71  BILITOT 0.9 0.3  PROT 7.0 6.7  ALBUMIN 3.0* 3.0*   No results for input(s): LIPASE, AMYLASE in the last 168 hours. No results for input(s): AMMONIA in the last 168 hours. Coagulation Profile: No results for input(s): INR, PROTIME in the last 168 hours. Cardiac Enzymes: No results for input(s): CKTOTAL, CKMB, CKMBINDEX, TROPONINI in the last 168 hours. BNP (last 3 results) No results for input(s): PROBNP in the last 8760 hours. HbA1C: No results for input(s): HGBA1C in the last 72 hours. CBG: No results for input(s): GLUCAP in the last 168 hours. Lipid Profile: Recent Labs    02/15/19 1048  TRIG 71   Thyroid Function Tests: No results for input(s): TSH, T4TOTAL,  FREET4, T3FREE, THYROIDAB in the last 72 hours. Anemia Panel: Recent Labs    02/15/19 1200 02/16/19 1140  FERRITIN 1,535* 2,290*   Urine analysis: No results found for: COLORURINE, APPEARANCEUR, LABSPEC, PHURINE, GLUCOSEU, HGBUR, BILIRUBINUR, KETONESUR, PROTEINUR, UROBILINOGEN, NITRITE, LEUKOCYTESUR Sepsis Labs: @LABRCNTIP (procalcitonin:4,lacticidven:4)  ) Recent Results (from the past 240 hour(s))  SARS Coronavirus 2 by RT PCR (hospital order, performed in [facility] hospital lab) Nasopharyngeal Nasopharyngeal Swab     Status: Abnormal   Collection Time: 02/15/19 10:47 AM   Specimen: Nasopharyngeal Swab  Result Value Ref Range Status   SARS Coronavirus 2 POSITIVE (A) NEGATIVE Final    Comment: RESULT CALLED TO, READ BACK BY AND VERIFIED WITH: Cherylann Ratel RN 12:55 02/15/19 ([email or web address]) (  NOTE) If result is NEGATIVE SARS-CoV-2 target nucleic acids are NOT DETECTED. The SARS-CoV-2 RNA is generally detectable in upper and lower  respiratory specimens during the acute phase of infection. The lowest  concentration of SARS-CoV-2 viral copies this assay can detect is 250  copies / mL. A negative result does not preclude SARS-CoV-2 infection  and should not be used as the sole basis for treatment or other  patient management decisions.  A negative result may occur with  improper specimen collection / handling, submission of specimen other  than nasopharyngeal swab, presence of viral mutation(s) within the  areas targeted by this assay, and inadequate number of viral copies  (<250 copies / mL). A negative result must be combined with clinical  observations, patient history, and epidemiological information. If result is POSITIVE SARS-CoV-2 target nucleic acids are DETECTED.  The SARS-CoV-2 RNA is generally detectable in upper and lower  respiratory specimens during the acute phase of infection.  Positive  results are indicative of active infection with SARS-CoV-2.  Clinical  correlation  with patient history and other diagnostic information is  necessary to determine patient infection status.  Positive results do  not rule out bacterial infection or co-infection with other viruses. If result is PRESUMPTIVE POSTIVE SARS-CoV-2 nucleic acids MAY BE PRESENT.   A presumptive positive result was obtained on the submitted specimen  and confirmed on repeat testing.  While 2019 novel coronavirus  (SARS-CoV-2) nucleic acids may be present in the submitted sample  additional confirmatory testing may be necessary for epidemiological  and / or clinical management purposes  to differentiate between  SARS-CoV-2 and other Sarbecovirus currently known to infect humans.  If clinically indicated additional testing with an alternate test  methodology [phone])  is advised. The SARS-CoV-2 RNA is generally  detectable in upper and lower respiratory specimens during the acute  phase of infection. The expected result is Negative. Fact Sheet for Patients:  StrictlyIdeas.no Fact Sheet for Healthcare Providers: BankingDealers.co.za This test is not yet approved or cleared by the [place] FDA and has been authorized for detection and/or diagnosis of SARS-CoV-2 by FDA under an Emergency Use Authorization (EUA).  This EUA will remain in effect (meaning this test can be used) for the duration of the COVID-19 declaration under Section 564(b)(1) of the Act, 21 U.S.C. section 360bbb-3(b)(1), unless the authorization is terminated or revoked sooner. Performed at [facility], [place] [address], [place], [place] [address]   Blood Culture (routine x 2)     Status: None (Preliminary result)   Collection Time: 02/15/19 10:50 AM   Specimen: BLOOD  Result Value Ref Range Status   Specimen Description BLOOD LEFT ANTECUBITAL  Final   Special Requests   Final    BOTTLES DRAWN AEROBIC AND ANAEROBIC Blood Culture adequate volume   Culture   Final    NO  GROWTH 1 DAY Performed at [facility], [place] [address] [place], [place] [address]    Report Status PENDING  Incomplete  Blood Culture (routine x 2)     Status: None (Preliminary result)   Collection Time: 02/15/19 11:10 AM   Specimen: BLOOD  Result Value Ref Range Status   Specimen Description BLOOD SITE NOT SPECIFIED  Final   Special Requests   Final    BOTTLES DRAWN AEROBIC AND ANAEROBIC Blood Culture adequate volume   Culture   Final    NO GROWTH 1 DAY Performed at [facility], [place] [address] [place], [place]  [id number]    Report Status PENDING  Incomplete         Radiology Studies: Dg Chest Port 1 View  Result Date: 02/15/2019 CLINICAL DATA:  Shortness of breath. EXAM: PORTABLE CHEST 1 VIEW COMPARISON:  None. FINDINGS: The heart size and mediastinal contours are within normal limits. No pneumothorax is noted. Bilateral patchy airspace opacities are noted in both lungs, right greater than left, concerning for multifocal pneumonia. Small right pleural effusion may be present. The visualized skeletal structures are unremarkable. IMPRESSION: Bilateral patchy airspace opacities are noted, right greater than left, concerning for multifocal pneumonia. Small right pleural effusion may be present. Electronically Signed   By: Marijo Conception M.D.   On: 02/15/2019 11:31        Scheduled Meds: . dexamethasone  6 mg Oral Q24H  . lisinopril  20 mg Oral Daily   And  . hydrochlorothiazide  12.5 mg Oral Daily  . Ipratropium-Albuterol  1 puff Inhalation QID  . pneumococcal 23 valent vaccine  0.5 mL Intramuscular Tomorrow-1000  . potassium chloride  40 mEq Oral BID  . senna  1 tablet Oral BID   Continuous Infusions: . remdesivir 100 mg in NS 250 mL       LOS: 1 day   The patient is critically ill with multiple organ systems failure and requires high complexity decision making for assessment and support, frequent evaluation and titration of therapies, application of  advanced monitoring technologies and extensive interpretation of multiple databases. Critical Care Time devoted to patient care services described in this note  Time spent: 40 minutes    , Geraldo Docker, MD [facility] Pager [phone]  If 7PM-7AM, please contact night-coverage [email or web address] Password [facility] 02/16/2019, 3:27 PM

## 2019-02-17 ENCOUNTER — Inpatient Hospital Stay (HOSPITAL_COMMUNITY): Payer: Medicaid Other

## 2019-02-17 LAB — D-DIMER, QUANTITATIVE: D-Dimer, Quant: 1.27 ug/mL-FEU — ABNORMAL HIGH (ref 0.00–0.50)

## 2019-02-17 LAB — COMPREHENSIVE METABOLIC PANEL
ALT: 96 U/L — ABNORMAL HIGH (ref 0–44)
AST: 48 U/L — ABNORMAL HIGH (ref 15–41)
Albumin: 2.8 g/dL — ABNORMAL LOW (ref 3.5–5.0)
Alkaline Phosphatase: 72 U/L (ref 38–126)
Anion gap: 7 (ref 5–15)
BUN: 25 mg/dL — ABNORMAL HIGH (ref 8–23)
CO2: 25 mmol/L (ref 22–32)
Calcium: 9.2 mg/dL (ref 8.9–10.3)
Chloride: 104 mmol/L (ref 98–111)
Creatinine, Ser: 0.87 mg/dL (ref 0.61–1.24)
GFR calc Af Amer: >60 mL/min (ref >60)
GFR calc non Af Amer: >60 mL/min (ref >60)
Glucose, Bld: 170 mg/dL — ABNORMAL HIGH (ref 70–99)
Potassium: 4.6 mmol/L (ref 3.5–5.1)
Sodium: 136 mmol/L (ref 135–145)
Total Bilirubin: 0.4 mg/dL (ref 0.3–1.2)
Total Protein: 6.3 g/dL — ABNORMAL LOW (ref 6.5–8.1)

## 2019-02-17 LAB — CBC WITH DIFFERENTIAL/PLATELET
Abs Immature Granulocytes: 0.1 10*3/uL — ABNORMAL HIGH (ref 0.00–0.07)
Basophils Absolute: 0 10*3/uL (ref 0.0–0.1)
Basophils Relative: 0 %
Eosinophils Absolute: 0 10*3/uL (ref 0.0–0.5)
Eosinophils Relative: 0 %
HCT: 42.4 % (ref 39.0–52.0)
Hemoglobin: 14.2 g/dL (ref 13.0–17.0)
Immature Granulocytes: 1 %
Lymphocytes Relative: 6 %
Lymphs Abs: 0.6 10*3/uL — ABNORMAL LOW (ref 0.7–4.0)
MCH: 29.3 pg (ref 26.0–34.0)
MCHC: 33.5 g/dL (ref 30.0–36.0)
MCV: 87.4 fL (ref 80.0–100.0)
Monocytes Absolute: 0.5 10*3/uL (ref 0.1–1.0)
Monocytes Relative: 4 %
Neutro Abs: 9.6 10*3/uL — ABNORMAL HIGH (ref 1.7–7.7)
Neutrophils Relative %: 89 %
Platelets: 311 10*3/uL (ref 150–400)
RBC: 4.85 MIL/uL (ref 4.22–5.81)
RDW: 13.2 % (ref 11.5–15.5)
WBC: 10.8 10*3/uL — ABNORMAL HIGH (ref 4.0–10.5)
nRBC: 0 % (ref 0.0–0.2)

## 2019-02-17 LAB — LIPID PANEL
Cholesterol: 91 mg/dL (ref 0–200)
HDL: 26 mg/dL — ABNORMAL LOW
LDL Cholesterol: 59 mg/dL (ref 0–99)
Total CHOL/HDL Ratio: 3.5 ratio
Triglycerides: 29 mg/dL
VLDL: 6 mg/dL (ref 0–40)

## 2019-02-17 LAB — PHOSPHORUS: Phosphorus: 2.3 mg/dL — ABNORMAL LOW (ref 2.5–4.6)

## 2019-02-17 LAB — HEPATITIS B SURFACE ANTIBODY, QUANTITATIVE: Hep B S AB Quant (Post): <3.1 m[IU]/mL — ABNORMAL LOW (ref >9.9)

## 2019-02-17 LAB — FERRITIN: Ferritin: 2306 ng/mL — ABNORMAL HIGH (ref 24–336)

## 2019-02-17 LAB — MAGNESIUM: Magnesium: 2.5 mg/dL — ABNORMAL HIGH (ref 1.7–2.4)

## 2019-02-17 LAB — NOVEL CORONAVIRUS, NAA: SARS-CoV-2, NAA: NOT DETECTED

## 2019-02-17 LAB — LACTATE DEHYDROGENASE: LDH: 295 U/L — ABNORMAL HIGH (ref 98–192)

## 2019-02-17 LAB — C-REACTIVE PROTEIN: CRP: 13.9 mg/dL — ABNORMAL HIGH

## 2019-02-17 NOTE — Progress Notes (Signed)
PROGRESS NOTE    [name]  [id number] DOB: 1946/04/06 DOA: 02/15/2019 PCP: Mack Hook, MD   Brief Narrative:  73 y.o.  Hispanic male  (Spanish-speaking only), PMHx HTN, HLD,    presenting with fatigue, shortness of breath. Patient states that he had a flu shot done on October 15. He had some chills afterwards. He states that he felt better until about 3 days ago when he started having myalgias and subjective shortness of breath. He called his doctor and had a Covid test done this morning at [facility]. When the test was done, he was noted to be tachypneic and was recommended to come to the ER for evaluation.His oxygen saturation was around 90 to 91% on room air. No known COVID contacts.  ED Course: Pt hemodynamically stable, Temp to 101, RA O2 sat 89%, CXR with bibasilar ground glass infiltrates, Covid +, nl WBC, all inflammatory markers elevated. He is referred to [facility] for admission for Covid pneumonia.   Subjective: 10/29/O x4, negative CP, negative S OB, last 24 hours afebrile   Assessment & Plan:   Active Problems:   Hypertension   Overweight (BMI 25.0-29.9)   Pneumonia due to COVID-19 virus   Hypokalemia   HLD (hyperlipidemia)    Covid pneumonia/acute respiratory failure with hypoxia COVID-19 Labs  Recent Labs    02/15/19 1047 02/15/19 1200 02/16/19 1140 02/17/19 0330  DDIMER 1.83*  --  1.59* 1.27*  FERRITIN  --  1,535* 2,290* 2,306*  LDH  --  381* 361* 295*  CRP  --  19.6*  --  13.9*    Lab Results  Component Value Date   SARSCOV2NAA POSITIVE (A) 02/15/2019  -Decadron 6 mg daily -Remdesivir per pharmacy protocol -10/27 Actemra x1 dose -Titrate O2 to maintain SPO2> 88% -Combivent QID -Flutter valve  Essential HTN -Lisinopril 20 mg daily -HCTZ 12.5 mg daily  HLD -10/29 LDL= 59  Hypokalemia -Potassium goal> 4 -Potassium p.o. 40 mg x 2 doses     DVT prophylaxis: Lovenox Code Status: Full Family Communication:   Disposition Plan: TBD   Consultants:    Procedures/Significant Events:     I have personally reviewed and interpreted all radiology studies and my findings are as above.  VENTILATOR SETTINGS: Nasal cannula Flow rate; 4 L/min   Cultures   Antimicrobials: Anti-infectives (From admission, onward)   Start     Stop   02/16/19 1600  remdesivir 100 mg in sodium chloride 0.9 % 250 mL IVPB     02/20/19 1559   02/15/19 1445  remdesivir 200 mg in sodium chloride 0.9 % 250 mL IVPB     02/15/19 1951   02/15/19 1215  cefTRIAXone (ROCEPHIN) 1 g in sodium chloride 0.9 % 100 mL IVPB     02/15/19 1335   02/15/19 1215  azithromycin (ZITHROMAX) 500 mg in sodium chloride 0.9 % 250 mL IVPB     02/15/19 1444       Devices    LINES / TUBES:      Continuous Infusions: . remdesivir 100 mg in NS 250 mL Stopped (02/16/19 1658)     Objective: Vitals:   02/16/19 2000 02/17/19 0000 02/17/19 0300 02/17/19 0745  BP:  114/62 (!) 89/62 93/67  Pulse:    66  Resp: 20 20  19   Temp:  98.2 F (36.8 C) 98.3 F (36.8 C) 98.1 F (36.7 C)  TempSrc:  Oral Oral Oral  SpO2:   91% 96%  Weight:      Height:  Intake/Output Summary (Last 24 hours) at 02/17/2019 [id number] Last data filed at 02/16/2019 2000 Gross per 24 hour  Intake 850 ml  Output 850 ml  Net 0 ml   Filed Weights   02/15/19 0925 02/15/19 1645  Weight: 81.6 kg 81.2 kg   Physical Exam:  General: A/O x4, positive acute respiratory distress Eyes: negative scleral hemorrhage, negative anisocoria, negative icterus ENT: Negative Runny nose, negative gingival bleeding, Neck:  Negative scars, masses, torticollis, lymphadenopathy, JVD Lungs: Clear to auscultation bilaterally without wheezes or crackles Cardiovascular: Regular rate and rhythm without murmur gallop or rub normal S1 and S2 Abdomen: negative abdominal pain, nondistended, positive soft, bowel sounds, no rebound, no ascites, no appreciable mass Extremities: No  significant cyanosis, clubbing, or edema bilateral lower extremities Skin: Negative rashes, lesions, ulcers Psychiatric:  Negative depression, negative anxiety, negative fatigue, negative mania  Central nervous system:  Cranial nerves II through XII intact, tongue/uvula midline, all extremities muscle strength 5/5, sensation intact throughout, negative dysarthria, negative expressive aphasia, negative receptive aphasia.  .     Data Reviewed: Care during the described time interval was provided by me .  I have reviewed this patient's available data, including medical history, events of note, physical examination, and all test results as part of my evaluation.   CBC: Recent Labs  Lab 02/15/19 1047 02/16/19 1140 02/17/19 0330  WBC 8.9 7.7 10.8*  NEUTROABS 7.3 6.8 9.6*  HGB 14.6 15.7 14.2  HCT 44.3 46.2 42.4  MCV 89.9 86.8 87.4  PLT 217 250 [id number]   Basic Metabolic Panel: Recent Labs  Lab 02/15/19 1200 02/16/19 1140 02/17/19 0330  NA 129* 135 136  K 3.4* 3.4* 4.6  CL 92* 99 104  CO2 24 25 25   GLUCOSE 132* 214* 170*  BUN 18 21 25*  CREATININE 1.27* 0.98 0.87  CALCIUM 8.6* 8.9 9.2  MG  --  2.6* 2.5*  PHOS  --  2.3* 2.3*   GFR: Estimated Creatinine Clearance: 75.3 mL/min (by C-G formula based on SCr of 0.87 mg/dL). Liver Function Tests: Recent Labs  Lab 02/15/19 1200 02/16/19 1140 02/17/19 0330  AST 79* 68* 48*  ALT 117* 113* 96*  ALKPHOS 61 71 72  BILITOT 0.9 0.3 0.4  PROT 7.0 6.7 6.3*  ALBUMIN 3.0* 3.0* 2.8*   No results for input(s): LIPASE, AMYLASE in the last 168 hours. No results for input(s): AMMONIA in the last 168 hours. Coagulation Profile: No results for input(s): INR, PROTIME in the last 168 hours. Cardiac Enzymes: No results for input(s): CKTOTAL, CKMB, CKMBINDEX, TROPONINI in the last 168 hours. BNP (last 3 results) No results for input(s): PROBNP in the last 8760 hours. HbA1C: No results for input(s): HGBA1C in the last 72 hours. CBG: No results  for input(s): GLUCAP in the last 168 hours. Lipid Profile: Recent Labs    02/15/19 1048 02/17/19 0330  CHOL  --  91  HDL  --  26*  LDLCALC  --  59  TRIG 71 29  CHOLHDL  --  3.5   Thyroid Function Tests: No results for input(s): TSH, T4TOTAL, FREET4, T3FREE, THYROIDAB in the last 72 hours. Anemia Panel: Recent Labs    02/16/19 1140 02/17/19 0330  FERRITIN 2,290* 2,306*   Urine analysis: No results found for: COLORURINE, APPEARANCEUR, LABSPEC, PHURINE, GLUCOSEU, HGBUR, BILIRUBINUR, KETONESUR, PROTEINUR, UROBILINOGEN, NITRITE, LEUKOCYTESUR Sepsis Labs: @LABRCNTIP (procalcitonin:4,lacticidven:4)  ) Recent Results (from the past 240 hour(s))  SARS Coronavirus 2 by RT PCR (hospital order, performed in [facility] hospital lab) Nasopharyngeal  Nasopharyngeal Swab     Status: Abnormal   Collection Time: 02/15/19 10:47 AM   Specimen: Nasopharyngeal Swab  Result Value Ref Range Status   SARS Coronavirus 2 POSITIVE (A) NEGATIVE Final    Comment: RESULT CALLED TO, READ BACK BY AND VERIFIED WITH: Cherylann Ratel RN 12:55 02/15/19 ([email or web address]) (NOTE) If result is NEGATIVE SARS-CoV-2 target nucleic acids are NOT DETECTED. The SARS-CoV-2 RNA is generally detectable in upper and lower  respiratory specimens during the acute phase of infection. The lowest  concentration of SARS-CoV-2 viral copies this assay can detect is 250  copies / mL. A negative result does not preclude SARS-CoV-2 infection  and should not be used as the sole basis for treatment or other  patient management decisions.  A negative result may occur with  improper specimen collection / handling, submission of specimen other  than nasopharyngeal swab, presence of viral mutation(s) within the  areas targeted by this assay, and inadequate number of viral copies  (<250 copies / mL). A negative result must be combined with clinical  observations, patient history, and epidemiological information. If result is POSITIVE SARS-CoV-2  target nucleic acids are DETECTED.  The SARS-CoV-2 RNA is generally detectable in upper and lower  respiratory specimens during the acute phase of infection.  Positive  results are indicative of active infection with SARS-CoV-2.  Clinical  correlation with patient history and other diagnostic information is  necessary to determine patient infection status.  Positive results do  not rule out bacterial infection or co-infection with other viruses. If result is PRESUMPTIVE POSTIVE SARS-CoV-2 nucleic acids MAY BE PRESENT.   A presumptive positive result was obtained on the submitted specimen  and confirmed on repeat testing.  While 2019 novel coronavirus  (SARS-CoV-2) nucleic acids may be present in the submitted sample  additional confirmatory testing may be necessary for epidemiological  and / or clinical management purposes  to differentiate between  SARS-CoV-2 and other Sarbecovirus currently known to infect humans.  If clinically indicated additional testing with an alternate test  methodology [id number])  is advised. The SARS-CoV-2 RNA is generally  detectable in upper and lower respiratory specimens during the acute  phase of infection. The expected result is Negative. Fact Sheet for Patients:  StrictlyIdeas.no Fact Sheet for Healthcare Providers: BankingDealers.co.za This test is not yet approved or cleared by the [place] FDA and has been authorized for detection and/or diagnosis of SARS-CoV-2 by FDA under an Emergency Use Authorization (EUA).  This EUA will remain in effect (meaning this test can be used) for the duration of the COVID-19 declaration under Section 564(b)(1) of the Act, 21 U.S.C. section 360bbb-3(b)(1), unless the authorization is terminated or revoked sooner. Performed at [facility], [place] [address] [place], [place] [address]   Blood Culture (routine x 2)     Status: None (Preliminary result)    Collection Time: 02/15/19 10:50 AM   Specimen: BLOOD  Result Value Ref Range Status   Specimen Description BLOOD LEFT ANTECUBITAL  Final   Special Requests   Final    BOTTLES DRAWN AEROBIC AND ANAEROBIC Blood Culture adequate volume   Culture   Final    NO GROWTH 2 DAYS Performed at [facility], [place] [address] [place], [place] [address]    Report Status PENDING  Incomplete  Blood Culture (routine x 2)     Status: None (Preliminary result)   Collection Time: 02/15/19 11:10 AM   Specimen: BLOOD  Result Value Ref Range Status  Specimen Description BLOOD SITE NOT SPECIFIED  Final   Special Requests   Final    BOTTLES DRAWN AEROBIC AND ANAEROBIC Blood Culture adequate volume   Culture   Final    NO GROWTH 2 DAYS Performed at [facility], [address], [place], [place] [address]    Report Status PENDING  Incomplete         Radiology Studies: Dg Chest Port 1 View  Result Date: 02/15/2019 CLINICAL DATA:  Shortness of breath. EXAM: PORTABLE CHEST 1 VIEW COMPARISON:  None. FINDINGS: The heart size and mediastinal contours are within normal limits. No pneumothorax is noted. Bilateral patchy airspace opacities are noted in both lungs, right greater than left, concerning for multifocal pneumonia. Small right pleural effusion may be present. The visualized skeletal structures are unremarkable. IMPRESSION: Bilateral patchy airspace opacities are noted, right greater than left, concerning for multifocal pneumonia. Small right pleural effusion may be present. Electronically Signed   By: Marijo Conception M.D.   On: 02/15/2019 11:31        Scheduled Meds: . dexamethasone  6 mg Oral Q24H  . lisinopril  20 mg Oral Daily   And  . hydrochlorothiazide  12.5 mg Oral Daily  . Ipratropium-Albuterol  1 puff Inhalation QID  . pneumococcal 23 valent vaccine  0.5 mL Intramuscular Tomorrow-1000  . senna  1 tablet Oral BID   Continuous Infusions: . remdesivir 100 mg in NS 250 mL  Stopped (02/16/19 1658)     LOS: 2 days   The patient is critically ill with multiple organ systems failure and requires high complexity decision making for assessment and support, frequent evaluation and titration of therapies, application of advanced monitoring technologies and extensive interpretation of multiple databases. Critical Care Time devoted to patient care services described in this note  Time spent: 40 minutes    WOODS, Geraldo Docker, MD [facility] Pager [phone]  If 7PM-7AM, please contact night-coverage [email or web address] Password [email or web address] 02/17/2019, 8:21 AM

## 2019-02-17 NOTE — Plan of Care (Signed)
Spoke with daughter to update on patient status and current POC - no further questions at this time.  Understands - will not re-assess patient's ability to go home, till Remdesivir finishes on 11/31.

## 2019-02-18 DIAGNOSIS — R0902 Hypoxemia: Secondary | ICD-10-CM

## 2019-02-18 LAB — COMPREHENSIVE METABOLIC PANEL
ALT: 118 U/L — ABNORMAL HIGH (ref 0–44)
AST: 84 U/L — ABNORMAL HIGH (ref 15–41)
Albumin: 2.8 g/dL — ABNORMAL LOW (ref 3.5–5.0)
Alkaline Phosphatase: 87 U/L (ref 38–126)
Anion gap: 8 (ref 5–15)
BUN: 25 mg/dL — ABNORMAL HIGH (ref 8–23)
CO2: 26 mmol/L (ref 22–32)
Calcium: 9 mg/dL (ref 8.9–10.3)
Chloride: 106 mmol/L (ref 98–111)
Creatinine, Ser: 0.87 mg/dL (ref 0.61–1.24)
GFR calc Af Amer: >60 mL/min (ref >60)
GFR calc non Af Amer: >60 mL/min (ref >60)
Glucose, Bld: 161 mg/dL — ABNORMAL HIGH (ref 70–99)
Potassium: 4.1 mmol/L (ref 3.5–5.1)
Sodium: 140 mmol/L (ref 135–145)
Total Bilirubin: 0.4 mg/dL (ref 0.3–1.2)
Total Protein: 6 g/dL — ABNORMAL LOW (ref 6.5–8.1)

## 2019-02-18 LAB — LACTATE DEHYDROGENASE: LDH: 291 U/L — ABNORMAL HIGH (ref 98–192)

## 2019-02-18 LAB — CBC WITH DIFFERENTIAL/PLATELET
Abs Immature Granulocytes: 0.12 10*3/uL — ABNORMAL HIGH (ref 0.00–0.07)
Basophils Absolute: 0 10*3/uL (ref 0.0–0.1)
Basophils Relative: 0 %
Eosinophils Absolute: 0 10*3/uL (ref 0.0–0.5)
Eosinophils Relative: 0 %
HCT: 42.1 % (ref 39.0–52.0)
Hemoglobin: 13.8 g/dL (ref 13.0–17.0)
Immature Granulocytes: 1 %
Lymphocytes Relative: 3 %
Lymphs Abs: 0.3 10*3/uL — ABNORMAL LOW (ref 0.7–4.0)
MCH: 29.2 pg (ref 26.0–34.0)
MCHC: 32.8 g/dL (ref 30.0–36.0)
MCV: 89.2 fL (ref 80.0–100.0)
Monocytes Absolute: 0.3 10*3/uL (ref 0.1–1.0)
Monocytes Relative: 3 %
Neutro Abs: 8.6 10*3/uL — ABNORMAL HIGH (ref 1.7–7.7)
Neutrophils Relative %: 93 %
Platelets: 372 10*3/uL (ref 150–400)
RBC: 4.72 MIL/uL (ref 4.22–5.81)
RDW: 13.4 % (ref 11.5–15.5)
WBC: 9.3 10*3/uL (ref 4.0–10.5)
nRBC: 0 % (ref 0.0–0.2)

## 2019-02-18 LAB — D-DIMER, QUANTITATIVE: D-Dimer, Quant: 2.78 ug/mL-FEU — ABNORMAL HIGH (ref 0.00–0.50)

## 2019-02-18 LAB — MAGNESIUM: Magnesium: 2.3 mg/dL (ref 1.7–2.4)

## 2019-02-18 LAB — PHOSPHORUS: Phosphorus: 3.7 mg/dL (ref 2.5–4.6)

## 2019-02-18 LAB — C-REACTIVE PROTEIN: CRP: 6.6 mg/dL — ABNORMAL HIGH (ref <1.0)

## 2019-02-18 LAB — FERRITIN: Ferritin: 1827 ng/mL — ABNORMAL HIGH (ref 24–336)

## 2019-02-18 MED ORDER — ENOXAPARIN SODIUM 40 MG/0.4ML ~~LOC~~ SOLN
40.0000 mg | SUBCUTANEOUS | Status: DC
  Administered 2019-02-18: 40 mg via SUBCUTANEOUS
  Filled 2019-02-18: qty 0.4

## 2019-02-18 NOTE — TOC Initial Note (Signed)
Transition of Care [facility]) - Initial/Assessment Note    Patient Details  Name: [name] MRN: [id number] Date of Birth: 08/06/1945  Transition of Care [facility]) CM/SW Contact:    Alberteen Sam, [place] Phone Number: [phone] 02/18/2019, 10:27 AM  Clinical Narrative:                  CSW spoke with patient's daughter Michelene Heady regarding discharge planning. She reports patient lives at home with her and she provides support as needed. She states patient was independent with all ADL's prior to admission and foresees no home needs at this time. She states patient has the "orange card" for medications and has no problems obtaining his meds. She reports family has been working with a hospital rep regarding emergency medicaid and are awaiting the paperwork to be sent to them. She states they are anticipating patient to return home in the next few days and are awaiting call from nurse or MD regarding any isolation procedures they should follow once patient home. No discharge needs identified at this time.   Please consult social work should needs arise prior to discharge.   Expected Discharge Plan: Home/Self Care Barriers to Discharge: Continued Medical Work up   Patient Goals and CMS Choice Patient states their goals for this hospitalization and ongoing recovery are:: to go home CMS Medicare.gov Compare Post Acute Care list provided to:: Patient Represenative (must comment)(daughter Michelene Heady) Choice offered to / list presented to : Adult Children(daughter Michelene Heady)  Expected Discharge Plan and Services Expected Discharge Plan: Home/Self Care       Living arrangements for the past 2 months: Single Family Home                                      Prior Living Arrangements/Services Living arrangements for the past 2 months: Single Family Home Lives with:: Adult Children Patient language and need for interpreter reviewed:: Yes Do you feel safe going back to the place where you live?: Yes       Need for Family Participation in Patient Care: Yes (Comment) Care giver support system in place?: Yes (comment)   Criminal Activity/Legal Involvement Pertinent to Current Situation/Hospitalization: No - Comment as needed  Activities of Daily Living Home Assistive Devices/Equipment: None ADL Screening (condition at time of admission) Patient's cognitive ability adequate to safely complete daily activities?: Yes Is the patient deaf or have difficulty hearing?: No Does the patient have difficulty seeing, even when wearing glasses/contacts?: No Does the patient have difficulty concentrating, remembering, or making decisions?: No Patient able to express need for assistance with ADLs?: Yes Does the patient have difficulty dressing or bathing?: No Independently performs ADLs?: Yes (appropriate for developmental age) Does the patient have difficulty walking or climbing stairs?: No Weakness of Legs: None Weakness of Arms/Hands: None  Permission Sought/Granted Permission sought to share information with : Case Manager, Customer service manager, Family Supports Permission granted to share information with : Yes, Verbal Permission Granted  Share Information with NAME: Michelene Heady     Permission granted to share info w Relationship: daughter  Permission granted to share info w Contact Information: [phone]  Emotional Assessment Appearance:: Other (Comment Required(unable to assess - remote) Attitude/Demeanor/Rapport: Unable to Assess Affect (typically observed): Unable to Assess Orientation: : Oriented to Self, Oriented to Place, Oriented to  Time, Oriented to Situation Alcohol / Substance Use: Not Applicable Psych Involvement: No (comment)  Admission diagnosis:  Shortness of breath [R06.02] Hypoxia [R09.02] Pneumonia due to COVID-19 virus [U07.1, J12.89] COVID-19 [U07.1] Patient Active Problem List   Diagnosis Date Noted  . Hypokalemia 02/16/2019  . HLD (hyperlipidemia)  02/16/2019  . Pneumonia due to COVID-19 virus 02/15/2019  . Elevated PSA, less than 10 ng/ml 11/12/2018  . Dental decay 06/29/2017  . Left inguinal hernia 06/29/2017  . Overweight (BMI 25.0-29.9) 04/28/2017  . Dyslipidemia 04/21/2016  . Hypertension 04/21/2006   PCP:  Mack Hook, MD Pharmacy:   [facility] [address] [place]. [place] [address] Phone: [phone] Fax: [place], [address] [place] [address] Phone: [phone] Fax: [phone]     Social Determinants of Health (SDOH) Interventions    Readmission Risk Interventions No flowsheet data found.

## 2019-02-18 NOTE — Discharge Planning (Signed)
Patient on 3L [place] at rest and maintaining low 90s SPO2. *Unable to wean O2 at this time. Walked in the hall [facility] and fell to [employer] in [facility] and maintained low 90s SPO2. If discharged - will need O2 at home.

## 2019-02-18 NOTE — Progress Notes (Signed)
PROGRESS NOTE    [name]  [id number] DOB: 11/07/1945 DOA: 02/15/2019 PCP: Mack Hook, MD   Brief Narrative:  73 y.o.  Hispanic male  (Spanish-speaking only), PMHx HTN, HLD,    presenting with fatigue, shortness of breath. Patient states that he had a flu shot done on October 15. He had some chills afterwards. He states that he felt better until about 3 days ago when he started having myalgias and subjective shortness of breath. He called his doctor and had a Covid test done this morning at [facility]. When the test was done, he was noted to be tachypneic and was recommended to come to the ER for evaluation.His oxygen saturation was around 90 to 91% on room air. No known COVID contacts.  ED Course: Pt hemodynamically stable, Temp to 101, RA O2 sat 89%, CXR with bibasilar ground glass infiltrates, Covid +, nl WBC, all inflammatory markers elevated. He is referred to [facility] for admission for Covid pneumonia.   Subjective: 10/30 A/O x4, negative CP, positive S OB.   Assessment & Plan:   Active Problems:   Hypertension   Overweight (BMI 25.0-29.9)   Pneumonia due to COVID-19 virus   Hypokalemia   HLD (hyperlipidemia)    Covid pneumonia/acute respiratory failure with hypoxia COVID-19 Labs  Recent Labs    02/16/19 1140 02/17/19 0330 02/18/19 0009  DDIMER 1.59* 1.27* 2.78*  FERRITIN 2,290* 2,306* 1,827*  LDH 361* 295* 291*  CRP  --  13.9* 6.6*    Lab Results  Component Value Date   SARSCOV2NAA POSITIVE (A) 02/15/2019   Paint Not Detected 02/15/2019  -Decadron 6 mg daily -Remdesivir per pharmacy protocol -10/27 Actemra x1 dose -Titrate O2 to maintain SPO2> 88% -Combivent QID -Flutter valve Patient on 3L [place] at rest and maintaining low 90s SPO2. *Unable to wean O2 at this time. Walked in the hall [facility] and fell to [employer] in [facility] and maintained low 90s SPO2. If discharged - will need O2 at home -Patient meets requirements  for home O2.  3 L O2 via [place] at all times titrate to maintain SPO2> 88%. -Provide [employer] portable oxygen concentrator   Essential HTN -Lisinopril 20 mg daily -HCTZ 12.5 mg daily  HLD -10/29 LDL= 59  Hypokalemia -Potassium goal> 4      DVT prophylaxis: Lovenox Code Status: Full Family Communication: 10/30 counseled Michelene Heady and husband on plan of care and discharge plan answered all questions  disposition Plan: TBD   Consultants:    Procedures/Significant Events:     I have personally reviewed and interpreted all radiology studies and my findings are as above.  VENTILATOR SETTINGS: Nasal cannula Flow rate; 4 L/min   Cultures   Antimicrobials: Anti-infectives (From admission, onward)   Start     Stop   02/16/19 1600  remdesivir 100 mg in sodium chloride 0.9 % 250 mL IVPB     02/20/19 1559   02/15/19 1445  remdesivir 200 mg in sodium chloride 0.9 % 250 mL IVPB     02/15/19 1951   02/15/19 1215  cefTRIAXone (ROCEPHIN) 1 g in sodium chloride 0.9 % 100 mL IVPB     02/15/19 1335   02/15/19 1215  azithromycin (ZITHROMAX) 500 mg in sodium chloride 0.9 % 250 mL IVPB     02/15/19 1444       Devices    LINES / TUBES:      Continuous Infusions: . remdesivir 100 mg in NS 250 mL 100 mg (02/17/19 1649)  Objective: Vitals:   02/17/19 1900 02/18/19 0000 02/18/19 0747 02/18/19 1142  BP: 111/69  110/72 105/83  Pulse:   62 73  Resp: (!) 22  (!) 23 (!) 22  Temp: 98.7 F (37.1 C) 98.7 F (37.1 C) 97.6 F (36.4 C) 97.9 F (36.6 C)  TempSrc: Oral Oral Oral Oral  SpO2:  91% 91% 94%  Weight:      Height:        Intake/Output Summary (Last 24 hours) at 02/18/2019 1304 Last data filed at 02/17/2019 2100 Gross per 24 hour  Intake 240 ml  Output -  Net 240 ml   Filed Weights   02/15/19 0925 02/15/19 1645  Weight: 81.6 kg 81.2 kg   Physical Exam:  General: A/O x4, positive acute respiratory distress Eyes: negative scleral hemorrhage, negative  anisocoria, negative icterus ENT: Negative Runny nose, negative gingival bleeding, Neck:  Negative scars, masses, torticollis, lymphadenopathy, JVD Lungs: Clear to auscultation, except decreased right basilar breath sounds without wheezes or crackles Cardiovascular: Regular rate and rhythm without murmur gallop or rub normal S1 and S2 Abdomen: negative abdominal pain, nondistended, positive soft, bowel sounds, no rebound, no ascites, no appreciable mass Extremities: No significant cyanosis, clubbing, or edema bilateral lower extremities Skin: Negative rashes, lesions, ulcers Psychiatric:  Negative depression, negative anxiety, negative fatigue, negative mania  Central nervous system:  Cranial nerves II through XII intact, tongue/uvula midline, all extremities muscle strength 5/5, sensation intact throughout,  negative expressive aphasia, negative receptive aphasia.  .     Data Reviewed: Care during the described time interval was provided by me .  I have reviewed this patient's available data, including medical history, events of note, physical examination, and all test results as part of my evaluation.   CBC: Recent Labs  Lab 02/15/19 1047 02/16/19 1140 02/17/19 0330 02/18/19 0009  WBC 8.9 7.7 10.8* 9.3  NEUTROABS 7.3 6.8 9.6* 8.6*  HGB 14.6 15.7 14.2 13.8  HCT 44.3 46.2 42.4 42.1  MCV 89.9 86.8 87.4 89.2  PLT 217 250 311 [id number]   Basic Metabolic Panel: Recent Labs  Lab 02/15/19 1200 02/16/19 1140 02/17/19 0330 02/18/19 0009  NA 129* 135 136 140  K 3.4* 3.4* 4.6 4.1  CL 92* 99 104 106  CO2 24 25 25 26   GLUCOSE 132* 214* 170* 161*  BUN 18 21 25* 25*  CREATININE 1.27* 0.98 0.87 0.87  CALCIUM 8.6* 8.9 9.2 9.0  MG  --  2.6* 2.5* 2.3  PHOS  --  2.3* 2.3* 3.7   GFR: Estimated Creatinine Clearance: 75.3 mL/min (by C-G formula based on SCr of 0.87 mg/dL). Liver Function Tests: Recent Labs  Lab 02/15/19 1200 02/16/19 1140 02/17/19 0330 02/18/19 0009  AST 79* 68* 48* 84*   ALT 117* 113* 96* 118*  ALKPHOS 61 71 72 87  BILITOT 0.9 0.3 0.4 0.4  PROT 7.0 6.7 6.3* 6.0*  ALBUMIN 3.0* 3.0* 2.8* 2.8*   No results for input(s): LIPASE, AMYLASE in the last 168 hours. No results for input(s): AMMONIA in the last 168 hours. Coagulation Profile: No results for input(s): INR, PROTIME in the last 168 hours. Cardiac Enzymes: No results for input(s): CKTOTAL, CKMB, CKMBINDEX, TROPONINI in the last 168 hours. BNP (last 3 results) No results for input(s): PROBNP in the last 8760 hours. HbA1C: No results for input(s): HGBA1C in the last 72 hours. CBG: No results for input(s): GLUCAP in the last 168 hours. Lipid Profile: Recent Labs    02/17/19 0330  CHOL 91  HDL 26*  LDLCALC 59  TRIG 29  CHOLHDL 3.5   Thyroid Function Tests: No results for input(s): TSH, T4TOTAL, FREET4, T3FREE, THYROIDAB in the last 72 hours. Anemia Panel: Recent Labs    02/17/19 0330 02/18/19 0009  FERRITIN 2,306* 1,827*   Urine analysis: No results found for: COLORURINE, APPEARANCEUR, LABSPEC, PHURINE, GLUCOSEU, HGBUR, BILIRUBINUR, KETONESUR, PROTEINUR, UROBILINOGEN, NITRITE, LEUKOCYTESUR Sepsis Labs: @LABRCNTIP (procalcitonin:4,lacticidven:4)  ) Recent Results (from the past 240 hour(s))  Novel Coronavirus, NAA ([facility])     Status: None   Collection Time: 02/15/19 12:00 AM   Specimen: Nasopharyngeal(NP) swabs in vial transport medium   NASOPHARYNGE  TESTING  Result Value Ref Range Status   SARS-CoV-2, NAA Not Detected Not Detected Final    Comment: This nucleic acid amplification test was developed and its performance characteristics determined by [employer], [employer] and [employer]. Nucleic acid amplification tests include PCR and TMA. This test has not been FDA cleared or approved. This test has been authorized by FDA under an Emergency Use Authorization (EUA). This test is only authorized for the duration of time the declaration that circumstances exist justifying the authorization of the  emergency use of in vitro diagnostic tests for detection of SARS-CoV-2 virus and/or diagnosis of COVID-19 infection under section 564(b)(1) of the Act, 21 U.S.C. [id number]) (1), unless the authorization is terminated or revoked sooner. When diagnostic testing is negative, the possibility of a false negative result should be considered in the context of a patient's recent exposures and the presence of clinical signs and symptoms consistent with COVID-19. An individual without symptoms of COVID-19 and who is not shedding SARS-CoV-2 virus would  expect to have a negative (not detected) result in this assay.   SARS Coronavirus 2 by RT PCR (hospital order, performed in [facility] hospital lab) Nasopharyngeal Nasopharyngeal Swab     Status: Abnormal   Collection Time: 02/15/19 10:47 AM   Specimen: Nasopharyngeal Swab  Result Value Ref Range Status   SARS Coronavirus 2 POSITIVE (A) NEGATIVE Final    Comment: RESULT CALLED TO, READ BACK BY AND VERIFIED WITH: Cherylann Ratel RN 12:55 02/15/19 ([email or web address]) (NOTE) If result is NEGATIVE SARS-CoV-2 target nucleic acids are NOT DETECTED. The SARS-CoV-2 RNA is generally detectable in upper and lower  respiratory specimens during the acute phase of infection. The lowest  concentration of SARS-CoV-2 viral copies this assay can detect is 250  copies / mL. A negative result does not preclude SARS-CoV-2 infection  and should not be used as the sole basis for treatment or other  patient management decisions.  A negative result may occur with  improper specimen collection / handling, submission of specimen other  than nasopharyngeal swab, presence of viral mutation(s) within the  areas targeted by this assay, and inadequate number of viral copies  (<250 copies / mL). A negative result must be combined with clinical  observations, patient history, and epidemiological information. If result is POSITIVE SARS-CoV-2 target nucleic acids are DETECTED.  The  SARS-CoV-2 RNA is generally detectable in upper and lower  respiratory specimens during the acute phase of infection.  Positive  results are indicative of active infection with SARS-CoV-2.  Clinical  correlation with patient history and other diagnostic information is  necessary to determine patient infection status.  Positive results do  not rule out bacterial infection or co-infection with other viruses. If result is PRESUMPTIVE POSTIVE SARS-CoV-2 nucleic acids MAY BE PRESENT.   A presumptive positive result was obtained on the submitted specimen  and confirmed on repeat testing.  While 2019 novel coronavirus  (SARS-CoV-2) nucleic acids may be present in the submitted sample  additional confirmatory testing may be necessary for epidemiological  and / or clinical management purposes  to differentiate between  SARS-CoV-2 and other Sarbecovirus currently known to infect humans.  If clinically indicated additional testing with an alternate test  methodology [phone])  is advised. The SARS-CoV-2 RNA is generally  detectable in upper and lower respiratory specimens during the acute  phase of infection. The expected result is Negative. Fact Sheet for Patients:  StrictlyIdeas.no Fact Sheet for Healthcare Providers: BankingDealers.co.za This test is not yet approved or cleared by the [place] FDA and has been authorized for detection and/or diagnosis of SARS-CoV-2 by FDA under an Emergency Use Authorization (EUA).  This EUA will remain in effect (meaning this test can be used) for the duration of the COVID-19 declaration under Section 564(b)(1) of the Act, 21 U.S.C. section 360bbb-3(b)(1), unless the authorization is terminated or revoked sooner. Performed at [facility], [place] [address] [place], [place] [address]   Blood Culture (routine x 2)     Status: None (Preliminary result)   Collection Time: 02/15/19 10:50 AM   Specimen:  BLOOD  Result Value Ref Range Status   Specimen Description BLOOD LEFT ANTECUBITAL  Final   Special Requests   Final    BOTTLES DRAWN AEROBIC AND ANAEROBIC Blood Culture adequate volume   Culture   Final    NO GROWTH 3 DAYS Performed at [facility], [place] [address], [place], [place] [address]    Report Status PENDING  Incomplete  Blood Culture (routine x 2)     Status: None (Preliminary result)   Collection Time: 02/15/19 11:10 AM   Specimen: BLOOD  Result Value Ref Range Status   Specimen Description BLOOD SITE NOT SPECIFIED  Final   Special Requests   Final    BOTTLES DRAWN AEROBIC AND ANAEROBIC Blood Culture adequate volume   Culture   Final    NO GROWTH 3 DAYS Performed at [facility], [address], [place], [place] [address]    Report Status PENDING  Incomplete         Radiology Studies: Portable Chest 1 View  Result Date: 02/17/2019 CLINICAL DATA:  Shortness of breath, pneumonia. EXAM: PORTABLE CHEST 1 VIEW COMPARISON:  February 15, 2019. FINDINGS: The heart size and mediastinal contours are within normal limits. Stable bilateral peripheral lung opacities are noted. The visualized skeletal structures are unremarkable. IMPRESSION: Stable bilateral lung opacities are noted consistent with multifocal pneumonia. Electronically Signed   By: Marijo Conception M.D.   On: 02/17/2019 08:43        Scheduled Meds: . dexamethasone  6 mg Oral Q24H  . lisinopril  20 mg Oral Daily   And  . hydrochlorothiazide  12.5 mg Oral Daily  . Ipratropium-Albuterol  1 puff Inhalation QID  . pneumococcal 23 valent vaccine  0.5 mL Intramuscular Tomorrow-1000  . senna  1 tablet Oral BID   Continuous Infusions: . remdesivir 100 mg in NS 250 mL 100 mg (02/17/19 1649)     LOS: 3 days   The patient is critically ill with multiple organ systems failure and requires high complexity decision making for assessment and support, frequent evaluation and titration of therapies,  application of advanced monitoring technologies and extensive interpretation of multiple databases. Critical Care Time devoted to patient care services described in this note  Time spent: 40 minutes    Brownie Gockel,  Geraldo Docker, MD [facility] Pager [phone]  If 7PM-7AM, please contact night-coverage [email or web address] Password [email or web address] 02/18/2019, 1:04 PM

## 2019-02-18 NOTE — Plan of Care (Signed)
Spoke with daughter and family on speaker phone, to discuss current POC and status.  Also discussed safety precautions at home and what to expect once patient comes home.  Talked out need for patient to isolate for additional 2 weeks and importance of wearing masks and washing hands for all otehrs in home that may come in contact with the patient.  Family voiced understanding of need to monitor patient and all others living in the home for increased SOB, temp, weakness and tiredness. *Family hopes patient may DC home this weekend as planned.  No further questions as this time.

## 2019-02-19 DIAGNOSIS — E876 Hypokalemia: Secondary | ICD-10-CM

## 2019-02-19 DIAGNOSIS — E7800 Pure hypercholesterolemia, unspecified: Secondary | ICD-10-CM

## 2019-02-19 LAB — COMPREHENSIVE METABOLIC PANEL
ALT: 220 U/L — ABNORMAL HIGH (ref 0–44)
AST: 201 U/L — ABNORMAL HIGH (ref 15–41)
Albumin: 2.9 g/dL — ABNORMAL LOW (ref 3.5–5.0)
Alkaline Phosphatase: 75 U/L (ref 38–126)
Anion gap: 9 (ref 5–15)
BUN: 27 mg/dL — ABNORMAL HIGH (ref 8–23)
CO2: 26 mmol/L (ref 22–32)
Calcium: 9.2 mg/dL (ref 8.9–10.3)
Chloride: 104 mmol/L (ref 98–111)
Creatinine, Ser: 1.01 mg/dL (ref 0.61–1.24)
GFR calc Af Amer: >60 mL/min (ref >60)
GFR calc non Af Amer: >60 mL/min (ref >60)
Glucose, Bld: 140 mg/dL — ABNORMAL HIGH (ref 70–99)
Potassium: 4.6 mmol/L (ref 3.5–5.1)
Sodium: 139 mmol/L (ref 135–145)
Total Bilirubin: 0.7 mg/dL (ref 0.3–1.2)
Total Protein: 6 g/dL — ABNORMAL LOW (ref 6.5–8.1)

## 2019-02-19 LAB — D-DIMER, QUANTITATIVE: D-Dimer, Quant: 3.16 ug/mL-FEU — ABNORMAL HIGH (ref 0.00–0.50)

## 2019-02-19 LAB — PHOSPHORUS: Phosphorus: 4.6 mg/dL (ref 2.5–4.6)

## 2019-02-19 LAB — CBC WITH DIFFERENTIAL/PLATELET
Abs Immature Granulocytes: 0.14 10*3/uL — ABNORMAL HIGH (ref 0.00–0.07)
Basophils Absolute: 0 10*3/uL (ref 0.0–0.1)
Basophils Relative: 0 %
Eosinophils Absolute: 0 10*3/uL (ref 0.0–0.5)
Eosinophils Relative: 0 %
HCT: 43.4 % (ref 39.0–52.0)
Hemoglobin: 14.5 g/dL (ref 13.0–17.0)
Immature Granulocytes: 2 %
Lymphocytes Relative: 5 %
Lymphs Abs: 0.4 10*3/uL — ABNORMAL LOW (ref 0.7–4.0)
MCH: 29.8 pg (ref 26.0–34.0)
MCHC: 33.4 g/dL (ref 30.0–36.0)
MCV: 89.1 fL (ref 80.0–100.0)
Monocytes Absolute: 0.3 10*3/uL (ref 0.1–1.0)
Monocytes Relative: 4 %
Neutro Abs: 7.2 10*3/uL (ref 1.7–7.7)
Neutrophils Relative %: 89 %
Platelets: 381 10*3/uL (ref 150–400)
RBC: 4.87 MIL/uL (ref 4.22–5.81)
RDW: 13.6 % (ref 11.5–15.5)
WBC: 8 10*3/uL (ref 4.0–10.5)
nRBC: 0 % (ref 0.0–0.2)

## 2019-02-19 LAB — FERRITIN: Ferritin: 1357 ng/mL — ABNORMAL HIGH (ref 24–336)

## 2019-02-19 LAB — MAGNESIUM: Magnesium: 2.2 mg/dL (ref 1.7–2.4)

## 2019-02-19 LAB — LACTATE DEHYDROGENASE: LDH: 302 U/L — ABNORMAL HIGH (ref 98–192)

## 2019-02-19 LAB — C-REACTIVE PROTEIN: CRP: 3.1 mg/dL — ABNORMAL HIGH (ref <1.0)

## 2019-02-19 MED ORDER — SENNA 8.6 MG PO TABS: 1.0000 | ORAL_TABLET | Freq: Two times a day (BID) | ORAL | 0 refills | Status: DC

## 2019-02-19 MED ORDER — LISINOPRIL 20 MG PO TABS: 20.0000 mg | ORAL_TABLET | Freq: Every day | ORAL | 0 refills | Status: DC

## 2019-02-19 MED ORDER — IPRATROPIUM-ALBUTEROL 20-100 MCG/ACT IN AERS: 1.0000 | INHALATION_SPRAY | Freq: Four times a day (QID) | RESPIRATORY_TRACT | 0 refills | Status: DC

## 2019-02-19 MED ORDER — DEXAMETHASONE 6 MG PO TABS: 6.0000 mg | ORAL_TABLET | ORAL | 0 refills | Status: DC

## 2019-02-19 MED ORDER — HYDROCHLOROTHIAZIDE 12.5 MG PO CAPS: 12.5000 mg | ORAL_CAPSULE | Freq: Every day | ORAL | 0 refills | Status: DC

## 2019-02-19 NOTE — Discharge Summary (Signed)
Physician Discharge Summary  [name] [id number] DOB: 1945/07/15 DOA: 02/15/2019  PCP: Mack Hook, MD  Admit date: 02/15/2019 Discharge date: 02/19/2019  Time spent: 30 minutes  Recommendations for Outpatient Follow-up: Covid pneumonia/acute respiratory failure with hypoxia COVID-19 Labs  Recent Labs (last 2 labs)        Recent Labs    02/16/19 1140 02/17/19 0330 02/18/19 0009  DDIMER 1.59* 1.27* 2.78*  FERRITIN 2,290* 2,306* 1,827*  LDH 361* 295* 291*  CRP  --  13.9* 6.6*      Recent Labs       Lab Results  Component Value Date   SARSCOV2NAA POSITIVE (A) 02/15/2019   [place] Not Detected 02/15/2019    -Decadron 6 mg daily -Remdesivir per pharmacy protocol -10/27 Actemra x1 dose -Titrate O2 to maintain SPO2> 88% -Combivent QID -Flutter valve Patient on 3L [place] at rest and maintaining low 90s SPO2. *Unable to wean O2 at this time. Walked in the hall [facility] and fell to [employer] in [employer] and maintained low 90s SPO2. If discharged - will need O2 at home -Patient meets requirements for home O2.  3 L O2 via [place] at all times titrate to maintain SPO2> 88%. -Provide Inogen portable oxygen concentrator   Essential HTN -Lisinopril 20 mg daily -HCTZ 12.5 mg daily  HLD -10/29 LDL= 59  Hypokalemia -Potassium goal> 4      Discharge Diagnoses:  Active Problems:   Hypertension   Overweight (BMI 25.0-29.9)   Pneumonia due to COVID-19 virus   Hypokalemia   HLD (hyperlipidemia)   Discharge Condition: Stable  Diet recommendation: Regular  Filed Weights   02/15/19 0925 02/15/19 1645  Weight: 81.6 kg 81.2 kg    History of present illness:  73 y.o. Hispanic male (Spanish-speaking only), PMHx HTN, HLD,    presenting with fatigue, shortness of breath. Patient states that he had a flu shot done on October 15. He had some chills afterwards. He states that he felt better until about 3 days ago when he started having  myalgias and subjective shortness of breath. He called his doctor and had a Covid test done this morning at [facility]. When the test was done, he was noted to be tachypneic and was recommended to come to the ER for evaluation.His oxygen saturation was around 90 to 91% on room air. No known COVID contacts  Hospital Course:  During his hospitalization patient was treated for Covid pneumonia/respiratory failure with hypoxia.  Currently stable for discharge.   Cultures   10/27 SARS coronavirus positive 10/27 blood left antecubital NGTD 10/27 blood NGTD 10/27 hepatitis B negative    Antibiotics Anti-infectives (From admission, onward)   Start     Stop   02/16/19 1600  remdesivir 100 mg in sodium chloride 0.9 % 250 mL IVPB     02/20/19 1559   02/15/19 1445  remdesivir 200 mg in sodium chloride 0.9 % 250 mL IVPB     02/15/19 1951   02/15/19 1215  cefTRIAXone (ROCEPHIN) 1 g in sodium chloride 0.9 % 100 mL IVPB     02/15/19 1335   02/15/19 1215  azithromycin (ZITHROMAX) 500 mg in sodium chloride 0.9 % 250 mL IVPB     02/15/19 1444       Discharge Exam: Vitals:   02/19/19 0500 02/19/19 0816 02/19/19 0820 02/19/19 0900  BP:  104/74 104/74   Pulse:  (!) 59  82  Resp: (!) 22 (!) 26  (!) 31  Temp:  97.8 F (36.6 C)  TempSrc:    Oral  SpO2:  90%  94%  Weight:      Height:        General: A/O x4, positive acute respiratory distress Eyes: negative scleral hemorrhage, negative anisocoria, negative icterus ENT: Negative Runny nose, negative gingival bleeding, Neck:  Negative scars, masses, torticollis, lymphadenopathy, JVD Lungs: Clear to auscultation, except decreased right basilar breath sounds without wheezes or crackles Cardiovascular: Regular rate and rhythm without murmur gallop or rub normal S1 and S2   Discharge Instructions   Allergies as of 02/19/2019   No Known Allergies     Medication List    STOP taking these medications   amLODipine 2.5 MG  tablet Commonly known as: NORVASC   lisinopril-hydrochlorothiazide 20-12.5 MG tablet Commonly known as: ZESTORETIC     TAKE these medications   dexamethasone 6 MG tablet Commonly known as: DECADRON Take 1 tablet (6 mg total) by mouth daily.   hydrochlorothiazide 12.5 MG capsule Commonly known as: MICROZIDE Take 1 capsule (12.5 mg total) by mouth daily. Start taking on: February 20, 2019   Ipratropium-Albuterol 20-100 MCG/ACT Aers respimat Commonly known as: COMBIVENT Inhale 1 puff into the lungs 4 (four) times daily.   lisinopril 20 MG tablet Commonly known as: ZESTRIL Take 1 tablet (20 mg total) by mouth daily. Start taking on: February 20, 2019   senna 8.6 MG Tabs tablet Commonly known as: SENOKOT Take 1 tablet (8.6 mg total) by mouth 2 (two) times daily.            Durable Medical Equipment  (From admission, onward)         Start     Ordered   02/18/19 1824  For home use only DME oxygen  Once    Comments: Patient on 3L [place] at rest and maintaining low 90s SPO2. *Unable to wean O2 at this time. Walked in the hall [facility] and fell to [employer] in [employer] [facility] and maintained low 90s SPO2. If discharged - will need O2 at home -Patient meets requirements for home O2.  3 L O2 via [place] at all times titrate to maintain SPO2> 88%. -Provide Inogen portable oxygen concentrator  Question Answer Comment  Length of Need 6 Months   Mode or (Route) Nasal cannula   Liters per Minute 3   Frequency Continuous (stationary and portable oxygen unit needed)   Oxygen conserving device Yes   Oxygen delivery system Gas      02/18/19 1824         No Known Allergies    The results of significant diagnostics from this hospitalization (including imaging, microbiology, ancillary and laboratory) are listed below for reference.    Significant Diagnostic Studies: Portable Chest 1 View  Result Date: 02/17/2019 CLINICAL DATA:  Shortness of breath, pneumonia. EXAM: PORTABLE CHEST 1  VIEW COMPARISON:  February 15, 2019. FINDINGS: The heart size and mediastinal contours are within normal limits. Stable bilateral peripheral lung opacities are noted. The visualized skeletal structures are unremarkable. IMPRESSION: Stable bilateral lung opacities are noted consistent with multifocal pneumonia. Electronically Signed   By: Marijo Conception M.D.   On: 02/17/2019 08:43   Dg Chest Port 1 View  Result Date: 02/15/2019 CLINICAL DATA:  Shortness of breath. EXAM: PORTABLE CHEST 1 VIEW COMPARISON:  None. FINDINGS: The heart size and mediastinal contours are within normal limits. No pneumothorax is noted. Bilateral patchy airspace opacities are noted in both lungs, right greater than left, concerning for multifocal  pneumonia. Small right pleural effusion may be present. The visualized skeletal structures are unremarkable. IMPRESSION: Bilateral patchy airspace opacities are noted, right greater than left, concerning for multifocal pneumonia. Small right pleural effusion may be present. Electronically Signed   By: Marijo Conception M.D.   On: 02/15/2019 11:31    Microbiology: Recent Results (from the past 240 hour(s))  Novel Coronavirus, NAA ([facility])     Status: None   Collection Time: 02/15/19 12:00 AM   Specimen: Nasopharyngeal(NP) swabs in vial transport medium   NASOPHARYNGE  TESTING  Result Value Ref Range Status   SARS-CoV-2, NAA Not Detected Not Detected Final    Comment: This nucleic acid amplification test was developed and its performance characteristics determined by [employer], [employer] and [employer]. Nucleic acid amplification tests include PCR and TMA. This test has not been FDA cleared or approved. This test has been authorized by FDA under an Emergency Use Authorization (EUA). This test is only authorized for the duration of time the declaration that circumstances exist justifying the authorization of the emergency use of in vitro diagnostic tests for detection of SARS-CoV-2 virus and/or  diagnosis of COVID-19 infection under section 564(b)(1) of the Act, 21 U.S.C. [id number]) (1), unless the authorization is terminated or revoked sooner. When diagnostic testing is negative, the possibility of a false negative result should be considered in the context of a patient's recent exposures and the presence of clinical signs and symptoms consistent with COVID-19. An individual without symptoms of COVID-19 and who is not shedding SARS-CoV-2 virus would  expect to have a negative (not detected) result in this assay.   SARS Coronavirus 2 by RT PCR (hospital order, performed in [facility] hospital lab) Nasopharyngeal Nasopharyngeal Swab     Status: Abnormal   Collection Time: 02/15/19 10:47 AM   Specimen: Nasopharyngeal Swab  Result Value Ref Range Status   SARS Coronavirus 2 POSITIVE (A) NEGATIVE Final    Comment: RESULT CALLED TO, READ BACK BY AND VERIFIED WITH: Cherylann Ratel RN 12:55 02/15/19 ([email or web address]) (NOTE) If result is NEGATIVE SARS-CoV-2 target nucleic acids are NOT DETECTED. The SARS-CoV-2 RNA is generally detectable in upper and lower  respiratory specimens during the acute phase of infection. The lowest  concentration of SARS-CoV-2 viral copies this assay can detect is 250  copies / mL. A negative result does not preclude SARS-CoV-2 infection  and should not be used as the sole basis for treatment or other  patient management decisions.  A negative result may occur with  improper specimen collection / handling, submission of specimen other  than nasopharyngeal swab, presence of viral mutation(s) within the  areas targeted by this assay, and inadequate number of viral copies  (<250 copies / mL). A negative result must be combined with clinical  observations, patient history, and epidemiological information. If result is POSITIVE SARS-CoV-2 target nucleic acids are DETECTED.  The SARS-CoV-2 RNA is generally detectable in upper and lower  respiratory specimens during  the acute phase of infection.  Positive  results are indicative of active infection with SARS-CoV-2.  Clinical  correlation with patient history and other diagnostic information is  necessary to determine patient infection status.  Positive results do  not rule out bacterial infection or co-infection with other viruses. If result is PRESUMPTIVE POSTIVE SARS-CoV-2 nucleic acids MAY BE PRESENT.   A presumptive positive result was obtained on the submitted specimen  and confirmed on repeat testing.  While 2019 novel coronavirus  (SARS-CoV-2) nucleic acids may be present in the submitted sample  additional confirmatory testing may be necessary for epidemiological  and / or clinical management purposes  to differentiate between  SARS-CoV-2 and other Sarbecovirus currently known to infect humans.  If clinically indicated additional testing with an alternate test  methodology [phone])  is advised. The SARS-CoV-2 RNA is generally  detectable in upper and lower respiratory specimens during the acute  phase of infection. The expected result is Negative. Fact Sheet for Patients:  StrictlyIdeas.no Fact Sheet for Healthcare Providers: BankingDealers.co.za This test is not yet approved or cleared by the [place] FDA and has been authorized for detection and/or diagnosis of SARS-CoV-2 by FDA under an Emergency Use Authorization (EUA).  This EUA will remain in effect (meaning this test can be used) for the duration of the COVID-19 declaration under Section 564(b)(1) of the Act, 21 U.S.C. section 360bbb-3(b)(1), unless the authorization is terminated or revoked sooner. Performed at [facility], [place] [address] [place], [place] [address]   Blood Culture (routine x 2)     Status: None (Preliminary result)   Collection Time: 02/15/19 10:50 AM   Specimen: BLOOD  Result Value Ref Range Status   Specimen Description BLOOD LEFT ANTECUBITAL  Final    Special Requests   Final    BOTTLES DRAWN AEROBIC AND ANAEROBIC Blood Culture adequate volume   Culture   Final    NO GROWTH 4 DAYS Performed at [facility], [place] [address], [place], [place] [address]    Report Status PENDING  Incomplete  Blood Culture (routine x 2)     Status: None (Preliminary result)   Collection Time: 02/15/19 11:10 AM   Specimen: BLOOD  Result Value Ref Range Status   Specimen Description BLOOD SITE NOT SPECIFIED  Final   Special Requests   Final    BOTTLES DRAWN AEROBIC AND ANAEROBIC Blood Culture adequate volume   Culture   Final    NO GROWTH 4 DAYS Performed at [facility], [address] [place], [place] [address]    Report Status PENDING  Incomplete     Labs: Basic Metabolic Panel: Recent Labs  Lab 02/15/19 1200 02/16/19 1140 02/17/19 0330 02/18/19 0009 02/19/19 0123  NA 129* 135 136 140 139  K 3.4* 3.4* 4.6 4.1 4.6  CL 92* 99 104 106 104  CO2 24 25 25 26 26   GLUCOSE 132* 214* 170* 161* 140*  BUN 18 21 25* 25* 27*  CREATININE 1.27* 0.98 0.87 0.87 1.01  CALCIUM 8.6* 8.9 9.2 9.0 9.2  MG  --  2.6* 2.5* 2.3 2.2  PHOS  --  2.3* 2.3* 3.7 4.6   Liver Function Tests: Recent Labs  Lab 02/15/19 1200 02/16/19 1140 02/17/19 0330 02/18/19 0009 02/19/19 0123  AST 79* 68* 48* 84* 201*  ALT 117* 113* 96* 118* 220*  ALKPHOS 61 71 72 87 75  BILITOT 0.9 0.3 0.4 0.4 0.7  PROT 7.0 6.7 6.3* 6.0* 6.0*  ALBUMIN 3.0* 3.0* 2.8* 2.8* 2.9*   No results for input(s): LIPASE, AMYLASE in the last 168 hours. No results for input(s): AMMONIA in the last 168 hours. CBC: Recent Labs  Lab 02/15/19 1047 02/16/19 1140 02/17/19 0330 02/18/19 0009 02/19/19 0123  WBC 8.9 7.7 10.8* 9.3 8.0  NEUTROABS 7.3 6.8 9.6* 8.6* 7.2  HGB 14.6 15.7 14.2 13.8 14.5  HCT 44.3 46.2 42.4 42.1 43.4  MCV 89.9 86.8 87.4 89.2 89.1  PLT 217 250 311 372 381   Cardiac Enzymes: No results for input(s): CKTOTAL, CKMB,  CKMBINDEX, TROPONINI in the last 168  hours. BNP: BNP (last 3 results) No results for input(s): BNP in the last 8760 hours.  ProBNP (last 3 results) No results for input(s): PROBNP in the last 8760 hours.  CBG: No results for input(s): GLUCAP in the last 168 hours.     Signed:  Dia Crawford, MD [facility] [phone] pager

## 2019-02-19 NOTE — Progress Notes (Signed)
Nsg Discharge Note  Admit Date:  02/15/2019 Discharge date: 02/19/2019   Devean Salvo-Sanchez to be D/C'd Home per MD order.  AVS completed.  Copy for chart, and copy for patient signed, and dated. Patient/caregiver able to verbalize understanding.  Discharge Medication: Allergies as of 02/19/2019   No Known Allergies     Medication List    STOP taking these medications   amLODipine 2.5 MG tablet Commonly known as: NORVASC   lisinopril-hydrochlorothiazide 20-12.5 MG tablet Commonly known as: ZESTORETIC     TAKE these medications   dexamethasone 6 MG tablet Commonly known as: DECADRON Take 1 tablet (6 mg total) by mouth daily.   hydrochlorothiazide 12.5 MG capsule Commonly known as: MICROZIDE Take 1 capsule (12.5 mg total) by mouth daily. Start taking on: February 20, 2019   Ipratropium-Albuterol 20-100 MCG/ACT Aers respimat Commonly known as: COMBIVENT Inhale 1 puff into the lungs 4 (four) times daily.   lisinopril 20 MG tablet Commonly known as: ZESTRIL Take 1 tablet (20 mg total) by mouth daily. Start taking on: February 20, 2019   senna 8.6 MG Tabs tablet Commonly known as: SENOKOT Take 1 tablet (8.6 mg total) by mouth 2 (two) times daily.            Durable Medical Equipment  (From admission, onward)         Start     Ordered   02/18/19 1824  For home use only DME oxygen  Once    Comments: Patient on 3L [place] at rest and maintaining low 90s SPO2. *Unable to wean O2 at this time. Walked in the hall [facility] and fell to [employer] [facility] [employer] and maintained low 90s SPO2. If discharged - will need O2 at home -Patient meets requirements for home O2.  3 L O2 via [place] at all times titrate to maintain SPO2> 88%. -Provide Inogen portable oxygen concentrator  Question Answer Comment  Length of Need 6 Months   Mode or (Route) Nasal cannula   Liters per Minute 3   Frequency Continuous (stationary and portable oxygen unit needed)   Oxygen conserving device Yes    Oxygen delivery system Gas      02/18/19 1824          Discharge Assessment: Vitals:   02/19/19 0915 02/19/19 0917  BP:    Pulse: 66 70  Resp: (!) 29 20  Temp:    SpO2: 90% (!) 89%   Skin clean, dry and intact without evidence of skin break down, no evidence of skin tears noted. IV catheter discontinued intact. Site without signs and symptoms of complications - no redness or edema noted at insertion site, patient denies c/o pain - only slight tenderness at site.  Dressing with slight pressure applied.  D/c Instructions-Education: Discharge instructions given to patient/family with verbalized understanding. D/c education completed with patient/family including follow up instructions, medication list, d/c activities limitations if indicated, with other d/c instructions as indicated by MD - patient able to verbalize understanding, all questions fully answered. Patient instructed to return to ED, call 911, or call MD for any changes in condition.  Patient escorted via [place], and D/C home via private auto.  Eda Keys, RN 02/19/2019 12:35 PM

## 2019-02-19 NOTE — Progress Notes (Addendum)
Interpreter number [phone] used to review d/c instructions with pt.   Called and spoke with Michelene Heady regarding discharge. Waiting on O2 to be delivered to the house.

## 2019-02-19 NOTE — TOC Transition Note (Signed)
Transition of Care [facility]) - CM/SW Discharge Note   Patient Details  Name: [name] MRN: [id number] Date of Birth: Aug 05, 1945  [facility] CM/SW Contact:  Ninfa Meeker, RN Phone Number: [phone] (working remotely) 02/19/2019, 12:54 PM   Clinical Narrative:   Case manager spoke with patient's daughter Michelene Heady via telephone to arrange for oxygen for home. Referral called to Jeneen Rinks with [employer], they will deliver concentrator and tanks to patient's home. CM contacted [facility] and requested a tank be taken to patient's room for discharge.    Final next level of care: Home/Self Care Barriers to Discharge: No Barriers Identified   Patient Goals and CMS Choice Patient states their goals for this hospitalization and ongoing recovery are:: to go home CMS Medicare.gov Compare Post Acute Care list provided to:: Patient Represenative (must comment)(daughter Michelene Heady) Choice offered to / list presented to : Adult Children(daughter Michelene Heady)  Discharge Placement                       Discharge Plan and Services     Post Acute Care Choice: Durable Medical Equipment          DME Arranged: Oxygen DME Agency: [facility] Date DME Agency Contacted: 02/19/19 Time DME Agency Contacted: [phone] Representative spoke with at DME Agency: Jeneen Rinks HH Arranged: NA [place] Agency: NA        Social Determinants of Health (SDOH) Interventions     Readmission Risk Interventions No flowsheet data found.

## 2019-02-19 NOTE — Plan of Care (Signed)

## 2019-02-19 NOTE — Discharge Instructions (Signed)
Preguntas frecuentes sobre el COVID-19 COVID-19 Frequently Asked Questions El COVID-19 (enfermedad por coronavirus) es una infeccin causada por una gran familia de virus. Algunos virus causan [employer] y otros causan enfermedades en animales tales como los camellos, los gatos y los murcilagos. En algunos casos, los virus que causan [employer] pueden transmitirse a los seres humanos. De dnde provino el coronavirus? En diciembre de 2019, [place] le inform a la Organizacin Mundial de la Salud (OMS) acerca de varios casos de enfermedad pulmonar (enfermedad respiratoria humana). Estos casos estaban vinculados con un mercado abierto de frutos de mar y [place] en [place]. El vnculo con el mercado de ganado y [employer] sugiere que el virus puede haberse propagado de los animales a los [place]. Sin embargo, desde Engineer, materials brote en diciembre, tambin se ha demostrado que el virus se contagia de [place] persona a Theatre manager. Cul es el nombre de la enfermedad y del virus? Nombre de la enfermedad Al principio, esta enfermedad se llam nuevo coronavirus. Esto se debe a que los cientficos determinaron que la enfermedad era causada por un nuevo virus respiratorio. La Organizacin Mundial de la Salud (OMS) ahora ha dado a la enfermedad el nombre de COVID-19, o enfermedad por coronavirus. Nombre del virus El virus causante de la enfermedad se conoce como coronavirus de tipo 2 causante del sndrome respiratorio agudo grave (SARS-CoV-2). Ms informacin sobre el nombre de la enfermedad y el virus Organizacin Mundial de la Bernice (OMS): www.who.int/emergencies/diseases/novel-coronavirus-2019/technical-guidance/naming-the-coronavirus-disease-(covid-2019)-and-the-virus-that-causes-it Quines estn en riesgo de sufrir complicaciones debido a la enfermedad por coronavirus? Algunas personas pueden tener un riesgo ms alto de tener complicaciones debido a la enfermedad por  coronavirus. Entre ellas se encuentran los [employer] y las personas que tienen enfermedades crnicas, como enfermedad cardaca, diabetes y enfermedad pulmonar. Si tiene un riesgo ms alto de Advice worker, tome estas precauciones adicionales:  Counselling psychologist cercano con personas que estn enfermas o que tengan fiebre o tos. Permanecer al menos a una distancia de 3 a 6 pies (1-33m) de las [employer], si es posible.  Lavarse las manos regularmente con agua y jabn durante al menos 20segundos.  Evitar tocarse la cara, la boca, la nariz y los ojos.  Tener a [employer] su casa, como alimentos, medicamentos y productos de limpieza.  Permanecer en su casa todo lo que sea posible.  [place] reuniones sociales y los viajes. Cmo se transmite la enfermedad causada por el coronavirus? El virus que causa la enfermedad por coronavirus se transmite fcilmente de Ardelia Mems persona a otra (es contagioso). Tambin hay casos de enfermedad de transmisin comunitaria. Esto significa que la enfermedad se ha propagado a:  Personas que no tienen contacto conocido con Doctor, hospital.  Personas que no han viajado a zonas donde hay casos conocidos. Aparentemente, se transmite de [place] persona a otra a travs de las [employer] se despiden al toser o al estornudar. Puedo contraer al virus al tocar superficies u objetos? Todava hay mucho que no se conoce acerca del virus que causa la enfermedad por coronavirus. Los cientficos basan gran parte de la informacin en lo que saben sobre virus similares, por ejemplo:  En general, los virus no sobreviven en superficies durante mucho tiempo. Necesitan un cuerpo humano (husped) para sobrevivir.  Es ms probable que el virus se contagie por contacto cercano con personas que estn enfermas (contacto directo), por ejemplo: ? Al estrechar las manos o abrazarse. ? Al inhalar las gotitas respiratorias  que se desplazan por el aire. Esto  puede ocurrir cuando una persona infectada tose o estornuda sobre o cerca de otras personas. °· Es menos probable que el virus se propague cuando una persona toca una superficie o un objeto sobre el que está el virus (contacto indirecto). El virus puede ingresar al cuerpo si la persona toca una superficie o un objeto y luego se toca la cara, los ojos, la nariz o la boca. °¿Una persona puede contagiar el virus sin tener síntomas de la enfermedad? °Puede ser posible que el virus se contagie antes de que la persona tenga síntomas de la enfermedad, pero muy probablemente esta no sea la principal forma en que el virus se está propagando. Es más probable que el virus se propague al estar en contacto directo con personas que están enfermas e inhalar las gotas respiratorias que una persona enferma despide al toser o estornudar. °¿Cuáles son los síntomas de la enfermedad causada por el coronavirus? °Los síntomas varían de una persona a otra y pueden variar de leves a graves. Entre los síntomas, se pueden incluir los siguientes: °· Fiebre. °· Tos. °· Cansancio, debilidad o fatiga. °· Respiración rápida o sensación de falta el aire. °Estos síntomas pueden aparecer en el término de 2 a 14 días después de haber estado expuesto al virus. Si presenta síntomas, llame al médico. Las personas con síntomas graves pueden necesitar atención hospitalaria. °Si estoy expuesto al virus, ¿cuánto tiempo tardan en aparecer los síntomas? °Los síntomas de la enfermedad por coronavirus pueden aparecer en cualquier momento en el término de 2 a 14 días después de que una persona haya estado expuesta al virus. Si presenta síntomas, llame al médico. °¿Debo hacerme un análisis de detección del virus? °El médico decidirá si debe realizarse un análisis en función de sus síntomas, antecedentes de exposición y factores de riesgo. °¿Cómo realiza el médico el análisis para detectar este virus? °Los médicos obtienen muestras para enviar a analizar. Estas  muestras pueden incluir lo siguiente: °· Tomar con un hisopo líquido de la nariz. °· Pedirle que tosa mucosidad (esputo) para extraer líquido de los pulmones en un recipiente estéril. °· Tomar una muestra de sangre. °· Tomar una muestra de heces u orina. °¿Hay algún tratamiento o vacuna para este virus? °Actualmente, no existe ninguna vacuna para prevenir la enfermedad por coronavirus. Además, no existen medicamentos como los antibióticos o los antivirales para tratar el virus. Una persona que se enferma recibe tratamiento de apoyo, lo que significa reposo y líquidos. Una persona también puede aliviar sus síntomas con medicamentos de venta libre para tratar los estornudos, la tos y el goteo nasal. Son los mismos medicamentos que se toman para el resfrío común. °Si presenta síntomas, llame al médico. Las personas con síntomas graves pueden necesitar atención hospitalaria. °¿Qué puedo hacer para protegerme y proteger a mi familia de este virus? ° °  ° °Puede protegerse y proteger a su familia tomando las mismas medidas que tomaría para prevenir el contagio de otros virus. Tome las siguientes medidas: °· Lavarse las manos regularmente con agua y jabón durante al menos 20 segundos. Usar desinfectante para manos con alcohol si no dispone de agua y jabón. °· Evitar tocarse la cara, la boca, la nariz y los ojos. °· Toser o estornudar en un pañuelo descartable, sobre su manga o codo. No toser o estornudar al aire ni cubrirse con la mano. °? Si tose o estornuda en un pañuelo de papel, deséchelo inmediatamente y lávese las manos. °· Desinfectar los objetos   y las superficies que se tocan con frecuencia todos los días. °· Evitar el contacto cercano con personas que estén enfermas o que tengan fiebre o tos. Permanecer al menos a una distancia de 3 a 6 pies (1-2 m) de las otras personas, si es posible. °· Quedarse en su casa si está enfermo, excepto para obtener atención médica. Llame al médico antes de buscar atención  médica. °· Asegúrese de tener las vacunas al día. Pregúntele al médico qué vacunas necesita. °¿Qué debo hacer si tengo que viajar? °Siga las recomendaciones relacionadas con los viajes de la autoridad de salud local, los CDC y la OMS. °Información y consejos para viajeros °· Centers for Disease Control and Prevention (CDC) (Centros para el Control y la Prevención de Enfermedades): www.cdc.gov/coronavirus/2019-ncov/travelers/index.html °· Organización Mundial de la Salud (OMS): www.who.int/emergencies/diseases/novel-coronavirus-2019/travel-advice °Conozca los riesgos y tome medidas para proteger su salud °· El riesgo de contraer la enfermedad por coronavirus es más alto si viaja a zonas con un brote o si está en contacto con viajeros que provienen de zonas donde hay un brote. °· Lávese las manos con frecuencia y mantenga una higiene adecuada para reducir el riesgo de contagiarse o transmitir el virus. °¿Qué debo hacer si estoy enfermo? °Instrucciones generales para detener la propagación de la infección °· Lavarse las manos regularmente con agua y jabón durante al menos 20 segundos. Usar desinfectante para manos con alcohol si no dispone de agua y jabón. °· Toser o estornudar en un pañuelo descartable, sobre su manga o codo. No toser o estornudar al aire ni cubrirse con la mano. °· Si tose o estornuda en un pañuelo de papel, deséchelo inmediatamente y lávese las manos. °· Quédese en su casa a menos que deba recibir atención médica. Llame al médico o a la autoridad de salud local antes de buscar atención médica. °· Evite las zonas públicas. No viaje en transporte público, de ser posible. °· Si puede, use un barbijo si debe salir de la casa o si está en contacto cercano con alguien que no está enfermo. °Mantenga su casa limpia °· Desinfecte los objetos y las superficies que se tocan con frecuencia todos los días. Pueden incluir: °? Encimeras y mesas. °? Picaportes e interruptores de luz. °? Lavabos, fregaderos y  grifos. °? Aparatos electrónicos tales como teléfonos, controles remotos, teclados, computadoras y tabletas. °· Lave los platos con agua jabonosa caliente o en el lavavajillas. Deje los platos para que se sequen al aire. °· Lave la ropa con agua caliente. °Evite infectar a otros miembros de la familia °· Permita que los miembros de la familia sanos cuiden a los niños y las mascotas, si es posible. Si tiene que cuidar a los niños o las mascotas, lávese las manos con frecuencia y use un barbijo. °· Duerma en una habitación o cama diferentes, si es posible. °· No comparta elementos personales, como afeitadoras, cepillos de dientes, desodorantes, peines, cepillos, toallas y toallitas de mano. °Dónde buscar más información °Centers for Disease Control and Prevention (CDC) °· Actualizaciones de información y novedades: www.cdc.gov/coronavirus/2019-ncov °Organización Mundial de la Salud (OMS) °· Actualizaciones de información y novedades: www.who.int/emergencies/diseases/novel-coronavirus-2019 °· Tema de salud relacionado con el coronavirus: www.who.int/health-topics/coronavirus °· Preguntas y respuestas sobre COVID-19: www.who.int/news-room/q-a-detail/q-a-coronaviruses °· Registro mundial: who.sprinklr.com °American Academy of Pediatrics (AAP) (Academia Estadounidense de Pediatría) °· Información para familias: www.healthychildren.org/English/health-issues/conditions/chest-lungs/Pages/2019-Novel-Coronavirus.aspx °La situación del coronavirus cambia rápidamente. Consulte el sitio web de su autoridad de salud local o los sitios web de los CDC y la OMS para enterarse de las novedades y noticias. °¿Cuándo debo   comunicarme con un mdico?  Comunquese con su mdico si tiene sntomas de infeccin, como fiebre o tos, y: ? Devota Pace cerca de alguien que sabe que tiene la enfermedad por coronavirus. ? Devota Pace en contacto con una persona que presuntamente sufra de la enfermedad por coronavirus. ? Ha viajado fuera del  pas. Cundo debo buscar asistencia mdica inmediata?  Busque ayuda de inmediato llamando al servicio de emergencias de su localidad (911 en los [place]) si tiene lo siguiente: ? Dificultad para respirar. ? Dolor u opresin en el pecho. ? Confusin. ? Labios y uas de [employer]. ? Dificultad para despertarse. ? Sntomas que empeoran. Informe al personal mdico de emergencias si cree que tiene la enfermedad por coronavirus. Resumen  Un nuevo virus respiratorio se propaga de Ardelia Mems persona a otra y causa COVID-19 (enfermedad por coronavirus).  El virus que causa el COVID-19 parece diseminarse fcilmente. Se transmite de [place] persona a otra a travs de las [employer] se despiden al toser o al estornudar.  Los [employer] y las personas que tienen enfermedades crnicas tienen mayor riesgo de Emergency planning/management officer enfermedad. Si tiene un riesgo ms alto de tener complicaciones, tome Geophysical data processor.  Actualmente, no existe ninguna vacuna para prevenir la enfermedad por coronavirus. No existen medicamentos, como los antibiticos o los antivirales, para tratar el virus.  Puede protegerse y proteger a su familia al lavarse las manos con frecuencia, evitar tocarse la cara y cubrirse al toser y Brewing technologist. Esta informacin no tiene Marine scientist el consejo del mdico. Asegrese de hacerle al mdico cualquier pregunta que tenga. Document Released: 08/16/2018 Document Revised: 08/16/2018 Document Reviewed: 08/16/2018 Elsevier Patient Education  2020 [employer].

## 2019-02-20 LAB — CULTURE, BLOOD (ROUTINE X 2)
Culture: NO GROWTH
Culture: NO GROWTH
Special Requests: ADEQUATE
Special Requests: ADEQUATE

## 2019-02-25 ENCOUNTER — Telehealth: Payer: Self-pay | Admitting: Internal Medicine

## 2019-02-25 NOTE — Telephone Encounter (Signed)
Pt. informed [name] will be calling to do the contact tracing with him. Pt agreed and states [name] can Speak to [name] (pt.'s daughter) she  will assist with any questions or concerns . Pt is unable to talk because he is on oxygen and will rather have daughter handle anything

## 2019-02-28 ENCOUNTER — Telehealth: Payer: Self-pay | Admitting: Internal Medicine

## 2019-02-28 NOTE — Telephone Encounter (Signed)
Per Dr. Amil Amen patient will be fine by then to come into the office for CPE. Please notify daughter.

## 2019-02-28 NOTE — Telephone Encounter (Signed)
Daughter Michelene Heady called stating patient has really bad Bronchitis along with + [phone]. She states  will like to know if pt. Will continue as a [facility] pt. After been informed will have another PCP.  Michelene Heady also stated pt needs a f/u after ED to take care the bronchitis issues pt is having now . Nicky advised Michelene Heady pt. Has to be 3 days symptoms free without take any medication and after share this information with the Nurse she will be receiving a call with information on how soon pt. Can be seen.   Please advise.

## 2019-03-01 NOTE — Telephone Encounter (Signed)
Daughter has been notified.

## 2019-03-14 ENCOUNTER — Ambulatory Visit (INDEPENDENT_AMBULATORY_CARE_PROVIDER_SITE_OTHER): Payer: Self-pay | Admitting: Internal Medicine

## 2019-03-14 ENCOUNTER — Other Ambulatory Visit: Payer: Self-pay

## 2019-03-14 ENCOUNTER — Encounter: Payer: Self-pay | Admitting: Internal Medicine

## 2019-03-14 VITALS — BP 118/78 | HR 66 | Resp 12 | Ht 66.25 in | Wt 172.0 lb

## 2019-03-14 DIAGNOSIS — Z8619 Personal history of other infectious and parasitic diseases: Secondary | ICD-10-CM

## 2019-03-14 DIAGNOSIS — Z0000 Encounter for general adult medical examination without abnormal findings: Secondary | ICD-10-CM

## 2019-03-14 DIAGNOSIS — Z8616 Personal history of COVID-19: Secondary | ICD-10-CM

## 2019-03-14 NOTE — Progress Notes (Signed)
Subjective:    Patient ID: [name], male   DOB: Jul 08, 1945, 73 y.o.   MRN: [id number]   HPI   Here for Male CPE:  1.  STE:  Does not perform.  Discussed much less likely at his age.    2.  PSA/DRE:  He has a history of elevated PSA of 6.7 06/2017.  Was seen at [facility] and underwent biopsy with no concerning results.  Was told he did not need to follow up.  No family history of prostate cancer.      3.  Guaiac Cards:  Performed March 2019 and negative.    4.  Colonoscopy:  Never.  No family history of colon cancer.  5.  Cholesterol/Glucose:  Patient hospitalized for COVID19 end of October.  He finished Dexamethasone 6 mg daily on November 10th. Daughter states he was discharged on October 31st.  His cholesterol in the hospital was quite low across the board and his blood glucose was in mid 100s.    6.  Immunizations:  Immunization History  Administered Date(s) Administered  . Influenza,inj,Quad PF,6+ Mos 02/03/2019  . Influenza-Unspecified 02/03/2019  . Pneumococcal Polysaccharide-23 05/05/2017  . Tdap 05/05/2017     Current Meds  Medication Sig  . hydrochlorothiazide (MICROZIDE) 12.5 MG capsule Take 1 capsule (12.5 mg total) by mouth daily.  . Ipratropium-Albuterol (COMBIVENT) 20-100 MCG/ACT AERS respimat Inhale 1 puff into the lungs 4 (four) times daily.  Marland Kitchen lisinopril (ZESTRIL) 20 MG tablet Take 1 tablet (20 mg total) by mouth daily.  . [DISCONTINUED] dexamethasone (DECADRON) 6 MG tablet Take 1 tablet (6 mg total) by mouth daily.    No Known Allergies   Past Medical History:  Diagnosis Date  . Dyslipidemia 2018  . Hypertension 2008    Past Surgical History:  Procedure Laterality Date  . INGUINAL HERNIA REPAIR Bilateral 11/17/2017   Procedure: LAPAROSCOPIC BILATERAL INGUINAL HERNIA REPAIR;  Surgeon: Ralene Ok, MD;  Location: [place];  Service: General;  Laterality: Bilateral;  . INSERTION OF MESH Bilateral 11/17/2017   Procedure: INSERTION  OF MESH;  Surgeon: Ralene Ok, MD;  Location: [place];  Service: General;  Laterality: Bilateral;  . PROSTATE BIOPSY      Family History  Problem Relation Age of Onset  . Hypertension Sister   . Hypertension Brother   . Hypertension Sister   . Hypertension Sister   . Stroke Brother   . Hypertension Brother   . Alcohol abuse Brother   . Cirrhosis Brother        Cause of death  . Hypertension Brother   . Hypertension Sister   . Hypertension Brother     Social History   Socioeconomic History  . Marital status: Divorced    Spouse name: Not on file  . Number of children: 3  . Years of education: 92 + 1 year college  . Highest education level: Not on file  Occupational History  . Occupation: Maintenance/custodial work in past  Social Needs  . Financial resource strain: Not on file  . Food insecurity    Worry: Never true    Inability: Never true  . Transportation needs    Medical: No    Non-medical: No  Tobacco Use  . Smoking status: Former Smoker    Packs/day: 0.10    Years: 2.00    Pack years: 0.20  . Smokeless tobacco: Never Used  . Tobacco comment: States was > 20 years ago  Substance and Sexual Activity  . Alcohol  use: Yes    Alcohol/week: 1.0 standard drinks    Types: 1 Cans of beer per week    Comment: occasionally  . Drug use: No  . Sexual activity: Not on file  Lifestyle  . Physical activity    Days per week: Not on file    Minutes per session: Not on file  . Stress: Not at all  Relationships  . Social Herbalist on phone: Not on file    Gets together: Not on file    Attends religious service: Not on file    Active member of club or organization: Not on file    Attends meetings of clubs or organizations: Not on file    Relationship status: Not on file  . Intimate partner violence    Fear of current or ex partner: Not on file    Emotionally abused: No    Physically abused: No    Forced sexual activity: Not on file  Other Topics  Concern  . Not on file  Social History Narrative   From [place]   Lives with daughter, Michelene Heady, her husband and her one child.      Review of Systems  Constitutional: Negative for appetite change and fatigue (Returning over past month when discharged from [place] hospitalization.).  HENT: Positive for dental problem (Needs a tooth removed on left lower jaw.  He has the number at the dental clinic to call. ) and hearing loss (Perhaps a mild hearing loss.). Negative for rhinorrhea and sore throat. Sinus pressure: `   Eyes: Negative for visual disturbance (Bifocals are fine for him still.).  Respiratory: Negative for cough and shortness of breath (Not quite back to baseline with breathing.  No oxygen for past 2 days.  Was sent home on O2.).   Cardiovascular: Negative for chest pain, palpitations and leg swelling.  Gastrointestinal: Negative for abdominal pain, blood in stool (No melena.), constipation and diarrhea.  Genitourinary: Negative for decreased urine volume, discharge, dysuria, frequency, penile pain, penile swelling and testicular pain.  Musculoskeletal: Negative for arthralgias.  Skin: Negative for rash.  Neurological: Negative for weakness and numbness.  Psychiatric/Behavioral: Negative for dysphoric mood. The patient is not nervous/anxious.       Objective:   BP 118/78 (BP Location: Left Arm, Patient Position: Sitting, Cuff Size: Normal)   Pulse 66   Resp 12   Ht 5' 6.25" (1.683 m)   Wt 172 lb (78 kg)   BMI 27.55 kg/m   Physical Exam  Constitutional: He is oriented to person, place, and time. He appears well-developed and well-nourished.  HENT:  Head: Normocephalic and atraumatic.  Right Ear: Tympanic membrane, external ear and ear canal normal.  Left Ear: Tympanic membrane, external ear and ear canal normal.  Nose: Nose normal.  Mouth/Throat: Uvula is midline, oropharynx is clear and moist and mucous membranes are normal. Dental caries present.  Significant dental  decay with loss of multiple teeth.  Eyes: Pupils are equal, round, and reactive to light. Conjunctivae and EOM are normal.  Neck: Normal range of motion and full passive range of motion without pain. Neck supple. No thyromegaly present.  Cardiovascular: Normal rate, regular rhythm, S1 normal and S2 normal. Exam reveals no S3, no S4 and no friction rub.  No murmur heard. No carotid bruits.  Carotid, radial, femoral, DP and PT pulses normal and equal.   Pulmonary/Chest: Effort normal and breath sounds normal.  Abdominal: Soft. Bowel sounds are normal. He exhibits no mass. There  is no hepatosplenomegaly. There is no abdominal tenderness. No hernia.  Genitourinary:    Penis normal.  Right testis shows no mass and no tenderness. Right testis is descended. Left testis shows no mass and no tenderness. Left testis is descended.  Musculoskeletal: Normal range of motion.  Lymphadenopathy:       Head (right side): No submental and no submandibular adenopathy present.       Head (left side): No submental and no submandibular adenopathy present.    He has no cervical adenopathy.    He has no axillary adenopathy.       Right: No inguinal and no supraclavicular adenopathy present.       Left: No inguinal and no supraclavicular adenopathy present.  Neurological: He is alert and oriented to person, place, and time. He has normal strength and normal reflexes. No cranial nerve deficit or sensory deficit. Coordination and gait normal.  Skin: Skin is warm. No rash noted.  Psychiatric: He has a normal mood and affect. His behavior is normal. Judgment and thought content normal. Cognition and memory are normal.     Assessment & Plan  1.  CPE Need to find Urology records from [facility]. To return for fasting labs in 3 months after adequate nutritional recovery and off Decadron for a time. FLP, CBC, CMP then Guaiac Cards x 3 to return in 2 weeks. Pneumococcal 13 vaccine in 1 month --not clear if received  antibodies during COVID hospitalization that could theoretically decrease immune response.  2.  Recent COVID 19 hospitalization:  Doing quite well now.  3.  Hypertension:  Controlled.  4.  Hyperglycemia and low cholesterol levels during Decadron usage and hospitalization.  Labs in 3 months as above  5.  History of elevated PSA with biopsy:  As above, need records.

## 2019-03-15 ENCOUNTER — Telehealth: Payer: Self-pay | Admitting: Internal Medicine

## 2019-03-15 NOTE — Telephone Encounter (Signed)
[name]; pt's daughter called requesting a letter from Dr. Amil Amen to discontinue oxygen to be faxed over [facility] at [phone].  Called the facility to get more  Information on what needs to be stated on the letter or if there is a form can be faxed over for Dr. Amil Amen to fill out and fax it back to them. They stated they do not fax form and the only thing that is need it is a discontinue  letter from the provider .  For any further question or concerns called [name] at [phone] option  3   [name] please advise.

## 2019-03-15 NOTE — Telephone Encounter (Signed)
To Dr. Mulberry for further directions 

## 2019-03-21 NOTE — Telephone Encounter (Signed)
Michelene Heady pt's daughter called to get any update on her request made on 03/15/2019. She stated called [facility]  and they stated they did not receive a letter  from [place] yet. Michelene Heady stated they still charging the patient for the oxygen until they receive the authorization from the Dr. Amil Amen.

## 2019-03-22 NOTE — Telephone Encounter (Signed)
Spoke with daughter informed patient has to have  O2 sat checked at rest and after walking around the clinic a couple of times. Daughter stated patient will come tomorrow to get Prevnar 13 done and will like to do it then. [name] okayed.

## 2019-03-22 NOTE — Telephone Encounter (Signed)
I thought we talked about this?:  He needs to come to clinic and have O2 sat checked at rest and after walking around the clinic a couple of times.   This can all be done outside If his sat is fine, I will discontinue.

## 2019-03-23 ENCOUNTER — Ambulatory Visit (INDEPENDENT_AMBULATORY_CARE_PROVIDER_SITE_OTHER): Payer: Self-pay

## 2019-03-23 ENCOUNTER — Other Ambulatory Visit: Payer: Self-pay

## 2019-03-23 ENCOUNTER — Encounter: Payer: Self-pay | Admitting: Internal Medicine

## 2019-03-23 VITALS — HR 120

## 2019-03-23 DIAGNOSIS — Z23 Encounter for immunization: Secondary | ICD-10-CM

## 2019-03-23 NOTE — Telephone Encounter (Signed)
Called daughter to informed letter was faxed over to [facility] care today 03/23/2019 and also to informed patient's  copy is ready to be picked up. Daughter agreed and verbalized understanding.

## 2019-03-23 NOTE — Telephone Encounter (Signed)
Patient came in today to get pulse ox and HR checked resting and after walking. I spoke with Dr. Amil Amen and gave his readings. Per Dr. Amil Amen patient can be discharged from home health with [facility]. Patient no longer needs oxygen at home. Letter has been written and at front desk.   Antony Madura please notify patient that it is ready for pick up

## 2019-03-28 ENCOUNTER — Other Ambulatory Visit (INDEPENDENT_AMBULATORY_CARE_PROVIDER_SITE_OTHER): Payer: Self-pay

## 2019-03-28 DIAGNOSIS — Z1211 Encounter for screening for malignant neoplasm of colon: Secondary | ICD-10-CM

## 2019-03-29 LAB — POC HEMOCCULT BLD/STL (HOME/3-CARD/SCREEN)
Card #2 Fecal Occult Blod, POC: NEGATIVE
Card #3 Fecal Occult Blood, POC: NEGATIVE
Fecal Occult Blood, POC: NEGATIVE

## 2019-04-07 ENCOUNTER — Telehealth: Payer: Self-pay | Admitting: Internal Medicine

## 2019-04-07 NOTE — Telephone Encounter (Signed)
Patient called stating received a call form the Dental clinic to set up an appointment . Patient unable to set up that appointment due to orange card is expired. Patient was told by dental clinic to call back when orange card gets updated . Patient will like to get information on status of his application.

## 2019-04-07 NOTE — Telephone Encounter (Signed)
Spoke to patient and states he mail the application to [id number] [place], [place] [address] Patient advised to reach out to [facility] regarding status of application Ph: [phone]

## 2019-04-11 ENCOUNTER — Telehealth: Payer: Self-pay | Admitting: Internal Medicine

## 2019-04-13 ENCOUNTER — Ambulatory Visit: Payer: Self-pay

## 2019-04-22 DIAGNOSIS — E785 Hyperlipidemia, unspecified: Secondary | ICD-10-CM

## 2019-04-22 HISTORY — DX: Hyperlipidemia, unspecified: E78.5

## 2019-04-23 ENCOUNTER — Other Ambulatory Visit: Payer: Self-pay | Admitting: Internal Medicine

## 2019-04-25 ENCOUNTER — Other Ambulatory Visit: Payer: Self-pay

## 2019-04-25 MED ORDER — LISINOPRIL-HYDROCHLOROTHIAZIDE 20-12.5 MG PO TABS: 1.0000 | ORAL_TABLET | Freq: Every day | ORAL | 11 refills | Status: DC

## 2019-06-14 ENCOUNTER — Other Ambulatory Visit: Payer: Self-pay

## 2019-06-14 NOTE — Patient Instructions (Signed)
Error

## 2019-06-16 ENCOUNTER — Other Ambulatory Visit (INDEPENDENT_AMBULATORY_CARE_PROVIDER_SITE_OTHER): Payer: Self-pay

## 2019-06-16 ENCOUNTER — Other Ambulatory Visit: Payer: Self-pay

## 2019-06-16 DIAGNOSIS — E7800 Pure hypercholesterolemia, unspecified: Secondary | ICD-10-CM

## 2019-06-16 DIAGNOSIS — Z79899 Other long term (current) drug therapy: Secondary | ICD-10-CM

## 2019-06-17 LAB — CBC WITH DIFFERENTIAL/PLATELET
Basophils Absolute: 0 10*3/uL (ref 0.0–0.2)
Basos: 0 %
EOS (ABSOLUTE): 0.3 10*3/uL (ref 0.0–0.4)
Eos: 5 %
Hematocrit: 49.1 % (ref 37.5–51.0)
Hemoglobin: 16.1 g/dL (ref 13.0–17.7)
Immature Grans (Abs): 0 10*3/uL (ref 0.0–0.1)
Immature Granulocytes: 0 %
Lymphocytes Absolute: 1.7 10*3/uL (ref 0.7–3.1)
Lymphs: 30 %
MCH: 29.3 pg (ref 26.6–33.0)
MCHC: 32.8 g/dL (ref 31.5–35.7)
MCV: 89 fL (ref 79–97)
Monocytes Absolute: 0.4 10*3/uL (ref 0.1–0.9)
Monocytes: 7 %
Neutrophils Absolute: 3.3 10*3/uL (ref 1.4–7.0)
Neutrophils: 58 %
Platelets: 253 10*3/uL (ref 150–450)
RBC: 5.5 x10E6/uL (ref 4.14–5.80)
RDW: 12.4 % (ref 11.6–15.4)
WBC: 5.7 10*3/uL (ref 3.4–10.8)

## 2019-06-17 LAB — COMPREHENSIVE METABOLIC PANEL
ALT: 26 IU/L (ref 0–44)
AST: 17 IU/L (ref 0–40)
Albumin/Globulin Ratio: 2 (ref 1.2–2.2)
Albumin: 4.7 g/dL (ref 3.7–4.7)
Alkaline Phosphatase: 91 IU/L (ref 39–117)
BUN/Creatinine Ratio: 11 (ref 10–24)
BUN: 12 mg/dL (ref 8–27)
Bilirubin Total: 0.4 mg/dL (ref 0.0–1.2)
CO2: 24 mmol/L (ref 20–29)
Calcium: 10.2 mg/dL (ref 8.6–10.2)
Chloride: 106 mmol/L (ref 96–106)
Creatinine, Ser: 1.11 mg/dL (ref 0.76–1.27)
GFR calc Af Amer: 77 mL/min/{1.73_m2} (ref >59)
GFR calc non Af Amer: 66 mL/min/{1.73_m2} (ref >59)
Globulin, Total: 2.3 g/dL (ref 1.5–4.5)
Glucose: 92 mg/dL (ref 65–99)
Potassium: 4.7 mmol/L (ref 3.5–5.2)
Sodium: 143 mmol/L (ref 134–144)
Total Protein: 7 g/dL (ref 6.0–8.5)

## 2019-06-17 LAB — LIPID PANEL W/O CHOL/HDL RATIO
Cholesterol, Total: 199 mg/dL (ref 100–199)
HDL: 48 mg/dL (ref >39)
LDL Chol Calc (NIH): 130 mg/dL — ABNORMAL HIGH (ref 0–99)
Triglycerides: 118 mg/dL (ref 0–149)
VLDL Cholesterol Cal: 21 mg/dL (ref 5–40)

## 2019-08-01 ENCOUNTER — Telehealth: Payer: Self-pay | Admitting: Internal Medicine

## 2019-08-01 NOTE — Telephone Encounter (Signed)
Patient's daughter Michelene Heady called stating patient had two weeks with redness on one side of the left eye and was using redness relief eye drops for it;  but stated yesterday the whole eye got really red and is hurting/itching now.  She stated when to the pharmacy yesterday looking for a different medication  and was told by the pharmacist  patient needs to be seen by his PCP in case  has an infection going  on his eye.

## 2019-08-02 NOTE — Telephone Encounter (Signed)
Patient coming in for acute visit on 08/03/19 for this issue

## 2019-08-03 ENCOUNTER — Ambulatory Visit: Payer: Self-pay | Admitting: Internal Medicine

## 2019-08-03 ENCOUNTER — Other Ambulatory Visit: Payer: Self-pay

## 2019-08-03 ENCOUNTER — Encounter: Payer: Self-pay | Admitting: Internal Medicine

## 2019-08-03 VITALS — BP 130/80 | HR 88 | Resp 12 | Ht 66.25 in | Wt 183.0 lb

## 2019-08-03 DIAGNOSIS — H1132 Conjunctival hemorrhage, left eye: Secondary | ICD-10-CM

## 2019-08-03 DIAGNOSIS — H5789 Other specified disorders of eye and adnexa: Secondary | ICD-10-CM

## 2019-08-03 HISTORY — DX: Conjunctival hemorrhage, left eye: H11.32

## 2019-08-03 NOTE — Patient Instructions (Signed)
Go to Dr. Zenia Resides office immediately and they will work you in.  Address:  [address] across the [address] from [facility]

## 2019-08-03 NOTE — Progress Notes (Signed)
    Subjective:    Patient ID: [name], male   DOB: 21-Apr-1946, 74 y.o.   MRN: [id number]   HPI   Left eye redness on and off for 1 month.  Was using Visine.  Last Friday, a little less than 1 week ago, the redness became progressively worse.  Has had minimal itching at times.  Has had some drainage, clear and watery.  Mild pain at times--feels scratchy--like has some sand in eye.  Otherwise, no sense of foreign body in eye. No history of injury.  No cold or cough symptoms prior. No history of bearing down, lifting something quite heavy prior. Vision has been blurry since Sunday, 3-4 days ago.   Started using Pheniramine and Naphazoline eye drops from [facility]. No bleeding from other sources:  Gums, cuts, etc.  No bruising.     Has been taking his Lisinopril/HCTZ without missing.  Does not check his bp anywhere else..  Current Meds  Medication Sig  . lisinopril-hydrochlorothiazide (ZESTORETIC) 20-12.5 MG tablet Take 1 tablet by mouth daily.   No Known Allergies   Review of Systems    Objective:   BP (!) 160/90 (BP Location: Left Arm, Patient Position: Sitting, Cuff Size: Normal)   Pulse 88   Resp 12   Ht 5' 6.25" (1.683 m)   Wt 183 lb (83 kg)   BMI 29.31 kg/m   Physical Exam  NAD HEENT:  PERRL, EOMI, left conjunctivae dark red and edematous appearing fairly diffusely.  Watering copiously as well.   Full visual field to confrontation.  No obvious foreign body.  No obvious debris in anterior chamber with basic ophthalmoscope. Neck:  Supple, No adenopathy Skin:  No obvious bruising.   Assessment & Plan   1.  ?Extensive left subconjunctival hemorrhage with edema of conjunctiva:  Would like the more thorough exam an Ophthalmologist can provide and whether any need for intervention. Called Dr. Zenia Resides office.  They will see him there today--to come right away. Drew blood for PT/PTT, CBC, CMP should this be recommended by Dr. Katy Fitch.  2.  Hypertension:  BP fine on  recheck today.  Continue Lisinopril/HCTZ

## 2019-08-03 NOTE — Addendum Note (Signed)
Addended by: Serafina Mitchell on: 08/03/2019 03:36 PM   Modules accepted: Orders

## 2019-09-12 ENCOUNTER — Ambulatory Visit: Payer: Self-pay | Admitting: Internal Medicine

## 2019-10-18 ENCOUNTER — Ambulatory Visit: Payer: Self-pay | Admitting: Internal Medicine

## 2019-10-18 ENCOUNTER — Other Ambulatory Visit: Payer: Self-pay

## 2019-10-18 ENCOUNTER — Encounter: Payer: Self-pay | Admitting: Internal Medicine

## 2019-10-18 VITALS — BP 122/70 | HR 62 | Resp 12 | Ht 66.25 in | Wt 182.0 lb

## 2019-10-18 DIAGNOSIS — R42 Dizziness and giddiness: Secondary | ICD-10-CM

## 2019-10-18 DIAGNOSIS — R55 Syncope and collapse: Secondary | ICD-10-CM

## 2019-10-18 DIAGNOSIS — I10 Essential (primary) hypertension: Secondary | ICD-10-CM

## 2019-10-18 NOTE — Progress Notes (Signed)
    Subjective:    Patient ID: [name], male   DOB: 11-27-1945, 74 y.o.   MRN: [id number]   HPI   Adult daughter, Michelene Heady, interprets  1.  Hypertension:  Controlled with Lisinopril/HCTZ.  Denies any recurrence of cough.  2.  Dizziness:  Since ill with COVID19.  States when he awakens in morning and stands up from bed, he gets dizzy.  Only time this happens.  States he drinks water frequently throughout the day.  Has never fallen or passed out with this.    Current Meds  Medication Sig  . lisinopril-hydrochlorothiazide (ZESTORETIC) 20-12.5 MG tablet Take 1 tablet by mouth daily.   No Known Allergies   Review of Systems    Objective:   BP 122/70 (BP Location: Left Arm, Patient Position: Sitting, Cuff Size: Normal)   Pulse 62   Resp 12   Ht 5' 6.25" (1.683 m)   Wt 182 lb (82.6 kg)   BMI 29.15 kg/m   Physical Exam  Orthostatics normal from lying to standing, both pulse and BP  NAD HEENT:  PERRL, EOMI Neck:  Supple, No adenopathy, No thyromegaly Chest:  CTA CV:  RRR with normal S1 and S2, No S3, S4 or murmur.  No carotid bruits.  Carotid, radial and DP pulses normal and equal. Abd:  S, NT, No HSM or mass.  + BS LE:  No edema   Assessment & Plan   1.  Hypertension:  Controlled.  2.  Presyncope: mild.  Discussed sitting on edge of bed first before standing--perhaps drinking a glass of water before standing.  3.  HM:  CPE in November and to call for flu clinic in September.

## 2020-03-20 ENCOUNTER — Encounter: Payer: Self-pay | Admitting: Internal Medicine

## 2020-03-20 ENCOUNTER — Other Ambulatory Visit: Payer: Self-pay

## 2020-03-20 ENCOUNTER — Ambulatory Visit: Payer: Self-pay | Admitting: Internal Medicine

## 2020-03-20 VITALS — BP 134/88 | HR 88 | Resp 20 | Ht 66.5 in | Wt 183.5 lb

## 2020-03-20 DIAGNOSIS — I10 Essential (primary) hypertension: Secondary | ICD-10-CM

## 2020-03-20 DIAGNOSIS — Z0000 Encounter for general adult medical examination without abnormal findings: Secondary | ICD-10-CM

## 2020-03-20 DIAGNOSIS — R972 Elevated prostate specific antigen [PSA]: Secondary | ICD-10-CM

## 2020-03-20 DIAGNOSIS — E785 Hyperlipidemia, unspecified: Secondary | ICD-10-CM

## 2020-03-20 DIAGNOSIS — Z1159 Encounter for screening for other viral diseases: Secondary | ICD-10-CM

## 2020-03-20 MED ORDER — LISINOPRIL-HYDROCHLOROTHIAZIDE 20-12.5 MG PO TABS: 1.0000 | ORAL_TABLET | Freq: Every day | ORAL | 11 refills | Status: DC

## 2020-03-20 NOTE — Progress Notes (Signed)
Subjective:    Patient ID: [name], male   DOB: October 03, 1945, 74 y.o.   MRN: [id number]   HPI   Interpreted by Jacqulyn Bath, son in law--married to Michelene Heady  Here for Male CPE:  1.  STE:  States he does perform. No findings  2.  PSA:  Elevated in 2019 and seen by Urology, underwent biopsy.  Patient uncertain as to whether was to have any follow up.  Cannot give information as to planned follow up.  States has not been back to Urology.  3.  Guaiac Cards:  Last performed 03/29/2019 and negative for blood.    4.  Colonoscopy:  Does not believe he has had.  No family history of colon cancer.  5.  Cholesterol/Glucose:  History of dyslipidemia and glucose normal in past. Lipid Panel     Component Value Date/Time   CHOL 199 06/16/2019 0908   TRIG 118 06/16/2019 0908   HDL 48 06/16/2019 0908   CHOLHDL 3.5 02/17/2019 0330   VLDL 6 02/17/2019 0330   LDLCALC 130 (H) 06/16/2019 0908   LABVLDL 21 06/16/2019 0908     6.  Immunizations: Has not had COVID booster.  Immunization History  Administered Date(s) Administered  . Influenza,inj,Quad PF,6+ Mos 02/03/2019  . Influenza-Unspecified 02/03/2019, 12/21/2019  . Moderna SARS-COVID-2 Vaccination 06/20/2019, 07/18/2019  . Pneumococcal Conjugate-13 03/23/2019  . Pneumococcal Polysaccharide-23 05/05/2017  . Tdap 05/05/2017      Current Meds  Medication Sig  . glucosamine-chondroitin 500-400 MG tablet Take 2 tablets by mouth daily.  Marland Kitchen lisinopril-hydrochlorothiazide (ZESTORETIC) 20-12.5 MG tablet Take 1 tablet by mouth daily.    No Known Allergies  Past Medical History:  Diagnosis Date  . Dyslipidemia 2018  . Dyslipidemia   . Hypertension 2008  . Subconjunctival hemorrhage of left eye 08/03/2019    Past Surgical History:  Procedure Laterality Date  . INGUINAL HERNIA REPAIR Bilateral 11/17/2017   Procedure: LAPAROSCOPIC BILATERAL INGUINAL HERNIA REPAIR;  Surgeon: Ralene Ok, MD;  Location: [facility];  Service: General;   Laterality: Bilateral;  . INSERTION OF MESH Bilateral 11/17/2017   Procedure: INSERTION OF MESH;  Surgeon: Ralene Ok, MD;  Location: [place];  Service: General;  Laterality: Bilateral;  . PROSTATE BIOPSY      Family History  Problem Relation Age of Onset  . Hypertension Sister   . Hypertension Brother   . Hypertension Sister   . Hypertension Sister   . Stroke Brother   . Hypertension Brother   . Alcohol abuse Brother   . Cirrhosis Brother        Cause of death  . Hypertension Brother   . Hypertension Brother     Social History   Socioeconomic History  . Marital status: Divorced    Spouse name: Not on file  . Number of children: 3  . Years of education: 31 + 1 year college  . Highest education level: Not on file  Occupational History  . Occupation: Maintenance/custodial work in past-retired  Tobacco Use  . Smoking status: Former Smoker    Packs/day: 0.10    Years: 2.00    Pack years: 0.20  . Smokeless tobacco: Never Used  . Tobacco comment: States was > 20 years ago  Vaping Use  . Vaping Use: Never used  Substance and Sexual Activity  . Alcohol use: Yes    Alcohol/week: 1.0 standard drink    Types: 1 Cans of beer per week    Comment: occasionally  . Drug use: No  .  Sexual activity: Not on file  Other Topics Concern  . Not on file  Social History Narrative   From [place]   Lives with daughter, Michelene Heady, her husband and her one son.   Social Determinants of Health   Financial Resource Strain: Low Risk   . Difficulty of Paying Living Expenses: Not hard at all  Food Insecurity: No Food Insecurity  . Worried About Charity fundraiser in the Last Year: Never true  . Ran Out of Food in the Last Year: Never true  Transportation Needs: No Transportation Needs  . Lack of Transportation (Medical): No  . Lack of Transportation (Non-Medical): No  Physical Activity:   . Days of Exercise per Week: Not on file  . Minutes of Exercise per Session: Not on file  Stress:   .  Feeling of Stress : Not on file  Social Connections:   . Frequency of Communication with Friends and Family: Not on file  . Frequency of Social Gatherings with Friends and Family: Not on file  . Attends Religious Services: Not on file  . Active Member of Clubs or Organizations: Not on file  . Attends Archivist Meetings: Not on file  . Marital Status: Not on file  Intimate Partner Violence: Not At Risk  . Fear of Current or Ex-Partner: No  . Emotionally Abused: No  . Physically Abused: No  . Sexually Abused: No    Review of Systems  Constitutional: Negative for appetite change, fatigue and fever.  HENT: Positive for dental problem (Has dental appt upcoming for a tooth with cavity.). Negative for rhinorrhea and sore throat.   Eyes: Positive for visual disturbance (Bifocals--needs new prescription.  Goes to [employer].).  Respiratory: Negative for cough and shortness of breath.   Cardiovascular: Negative for chest pain, palpitations and leg swelling.  Gastrointestinal: Negative for abdominal pain, blood in stool, constipation and diarrhea.  Genitourinary: Negative for dysuria and frequency.       Describes complete bladder emptying with urination.  Musculoskeletal: Positive for arthralgias (Knees only with a lot of walking.).  Skin: Negative for rash.  Neurological: Negative for weakness and numbness.  Psychiatric/Behavioral: Negative for dysphoric mood. The patient is not nervous/anxious.       Objective:   BP 134/88 (BP Location: Left Arm, Patient Position: Sitting, Cuff Size: Normal)   Pulse 88   Resp 20   Ht 5' 6.5" (1.689 m)   Wt 183 lb 8 oz (83.2 kg)   BMI 29.17 kg/m   Physical Exam HENT:     Head: Normocephalic and atraumatic.     Right Ear: Tympanic membrane, ear canal and external ear normal.     Left Ear: Tympanic membrane, ear canal and external ear normal.     Nose: Nose normal.     Mouth/Throat:     Mouth: Mucous membranes are moist.      Pharynx: Oropharynx is clear. Uvula midline.     Comments: Dental decay, gingival recession and dental loss. Eyes:     Extraocular Movements: Extraocular movements intact.     Conjunctiva/sclera: Conjunctivae normal.     Pupils: Pupils are equal, round, and reactive to light.     Comments: RR + OU  Neck:     Thyroid: No thyroid mass or thyromegaly.  Cardiovascular:     Rate and Rhythm: Normal rate and regular rhythm.     Heart sounds: S1 normal and S2 normal. No murmur heard.  No friction rub. No S3 or  S4 sounds.      Comments: No carotid bruits.  Carotid, radial, femoral, DP and PT pulses normal and equal.  Pulmonary:     Effort: Pulmonary effort is normal.     Breath sounds: Normal breath sounds.  Abdominal:     General: Bowel sounds are normal.     Palpations: Abdomen is soft. There is no hepatomegaly, splenomegaly or mass.     Tenderness: There is no abdominal tenderness.     Hernia: No hernia is present. There is no hernia in the left inguinal area or right inguinal area.  Genitourinary:    Penis: Uncircumcised.      Testes:        Right: Mass or tenderness not present. Right testis is descended.        Left: Mass or tenderness not present. Left testis is descended.  Musculoskeletal:        General: Normal range of motion.     Cervical back: Normal range of motion and neck supple.     Right lower leg: No edema.     Left lower leg: No edema.  Feet:     Comments: Feet Dry. Lymphadenopathy:     Head:     Right side of head: No submental or submandibular adenopathy.     Left side of head: No submental or submandibular adenopathy.     Cervical: No cervical adenopathy.     Upper Body:     Right upper body: No supraclavicular adenopathy.     Left upper body: No supraclavicular adenopathy.     Lower Body: No right inguinal adenopathy. No left inguinal adenopathy.  Skin:    General: Skin is warm.     Capillary Refill: Capillary refill takes less than 2 seconds.     Findings:  No rash.  Neurological:     Mental Status: He is alert and oriented to person, place, and time.     Cranial Nerves: Cranial nerves are intact.     Sensory: Sensation is intact.     Motor: Motor function is intact.     Coordination: Coordination is intact.     Gait: Gait is intact.     Deep Tendon Reflexes: Reflexes are normal and symmetric.  Psychiatric:        Attention and Perception: Attention and perception normal.        Mood and Affect: Mood normal.        Behavior: Behavior normal.        Thought Content: Thought content normal.        Judgment: Judgment normal.      Assessment & Plan   1.  CPE Schedule COVID Moderna booster for next Monday. Stool cards to return in 2 weeks. CBC, CMP, FLP  2.  History of elevated total PSA:  Need to clarifiy recommended follow up.  Patient in past states he was told he did not need followup.  Spoke with med recs with Dr. Cherylann Banas is now retired.  Did have free PSA 09/17/2017 Biopsy 11/05/2017 Was to follow up in 6 months with repeat PSA, but did not, likely due to pandemic. She will fax over records. 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   Again, was to follow up with repeat PSA in 6 months.  Will obtain PSA total and free today.  3.  Hypertension:  BP okay.  States a bit nervous today.  Continue Lisinopril/HCTZ  4.  Dry feet:  Recommended foot hydrating cream after showers daily.

## 2020-03-20 NOTE — Patient Instructions (Signed)
Return stool cards in 2 weeks.

## 2020-03-21 LAB — COMPREHENSIVE METABOLIC PANEL
ALT: 23 IU/L (ref 0–44)
AST: 19 IU/L (ref 0–40)
Albumin/Globulin Ratio: 2.6 — ABNORMAL HIGH (ref 1.2–2.2)
Albumin: 5.1 g/dL — ABNORMAL HIGH (ref 3.7–4.7)
Alkaline Phosphatase: 91 IU/L (ref 44–121)
BUN/Creatinine Ratio: 13 (ref 10–24)
BUN: 15 mg/dL (ref 8–27)
Bilirubin Total: 0.8 mg/dL (ref 0.0–1.2)
CO2: 24 mmol/L (ref 20–29)
Calcium: 10.4 mg/dL — ABNORMAL HIGH (ref 8.6–10.2)
Chloride: 105 mmol/L (ref 96–106)
Creatinine, Ser: 1.12 mg/dL (ref 0.76–1.27)
GFR calc Af Amer: 75 mL/min/{1.73_m2} (ref >59)
GFR calc non Af Amer: 65 mL/min/{1.73_m2} (ref >59)
Globulin, Total: 2 g/dL (ref 1.5–4.5)
Glucose: 108 mg/dL — ABNORMAL HIGH (ref 65–99)
Potassium: 4.9 mmol/L (ref 3.5–5.2)
Sodium: 142 mmol/L (ref 134–144)
Total Protein: 7.1 g/dL (ref 6.0–8.5)

## 2020-03-21 LAB — CBC WITH DIFFERENTIAL/PLATELET
Basophils Absolute: 0 10*3/uL (ref 0.0–0.2)
Basos: 1 %
EOS (ABSOLUTE): 0.7 10*3/uL — ABNORMAL HIGH (ref 0.0–0.4)
Eos: 11 %
Hematocrit: 48.9 % (ref 37.5–51.0)
Hemoglobin: 16.8 g/dL (ref 13.0–17.7)
Immature Grans (Abs): 0 10*3/uL (ref 0.0–0.1)
Immature Granulocytes: 1 %
Lymphocytes Absolute: 1.6 10*3/uL (ref 0.7–3.1)
Lymphs: 24 %
MCH: 29.8 pg (ref 26.6–33.0)
MCHC: 34.4 g/dL (ref 31.5–35.7)
MCV: 87 fL (ref 79–97)
Monocytes Absolute: 0.5 10*3/uL (ref 0.1–0.9)
Monocytes: 8 %
Neutrophils Absolute: 3.6 10*3/uL (ref 1.4–7.0)
Neutrophils: 55 %
Platelets: 226 10*3/uL (ref 150–450)
RBC: 5.63 x10E6/uL (ref 4.14–5.80)
RDW: 13 % (ref 11.6–15.4)
WBC: 6.5 10*3/uL (ref 3.4–10.8)

## 2020-03-21 LAB — LIPID PANEL W/O CHOL/HDL RATIO
Cholesterol, Total: 222 mg/dL — ABNORMAL HIGH (ref 100–199)
HDL: 50 mg/dL (ref >39)
LDL Chol Calc (NIH): 152 mg/dL — ABNORMAL HIGH (ref 0–99)
Triglycerides: 114 mg/dL (ref 0–149)
VLDL Cholesterol Cal: 20 mg/dL (ref 5–40)

## 2020-03-21 LAB — HEPATITIS C ANTIBODY: Hep C Virus Ab: <0.1 s/co ratio (ref 0.0–0.9)

## 2020-03-21 LAB — PSA, TOTAL AND FREE
PSA, Free Pct: 14.2 %
PSA, Free: 1.22 ng/mL
Prostate Specific Ag, Serum: 8.6 ng/mL — ABNORMAL HIGH (ref 0.0–4.0)

## 2020-03-26 ENCOUNTER — Other Ambulatory Visit: Payer: Self-pay | Admitting: Internal Medicine

## 2020-03-26 ENCOUNTER — Other Ambulatory Visit: Payer: Self-pay

## 2020-04-21 HISTORY — PX: PROSTATE BIOPSY: SHX241

## 2020-04-27 ENCOUNTER — Telehealth: Payer: Self-pay | Admitting: Clinical

## 2020-04-27 NOTE — Telephone Encounter (Signed)
LCSW contacted patient on behalf of staff to get dates of active of [place] to continue referral. Patient provided dates 04/11/20-10/10/20

## 2020-09-17 ENCOUNTER — Ambulatory Visit: Payer: Self-pay | Admitting: Internal Medicine

## 2020-09-24 ENCOUNTER — Ambulatory Visit: Payer: Self-pay | Admitting: Internal Medicine

## 2020-10-06 IMAGING — DX DG CHEST 1V PORT
1 series · 1 of 1 positions shown · non-contrast
Comparison: None.

CLINICAL DATA: Shortness of breath.

EXAM:
PORTABLE CHEST 1 VIEW

[chest ap]
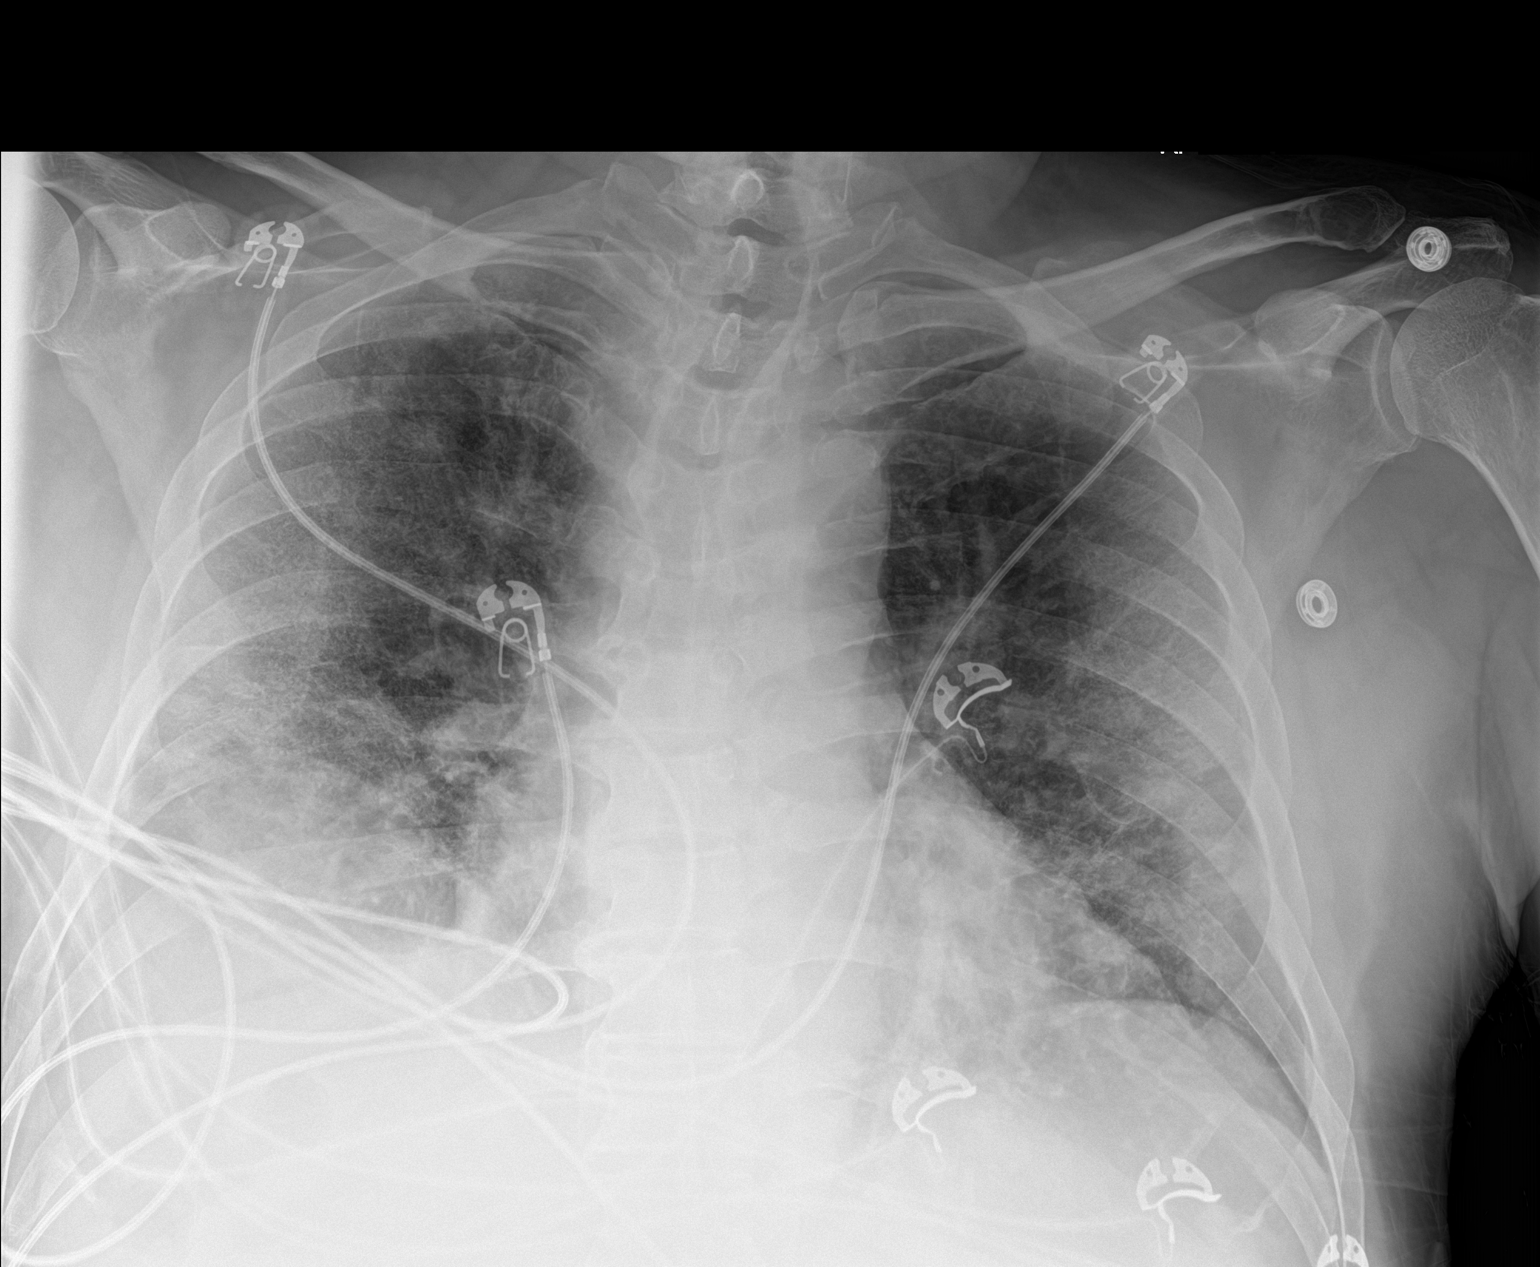

[1 of 1 positions shown; findings below may reference images not displayed]

FINDINGS: The heart size and mediastinal contours are within normal limits. No
pneumothorax is noted. Bilateral patchy airspace opacities are noted
in both lungs, right greater than left, concerning for multifocal
pneumonia. Small right pleural effusion may be present. The
visualized skeletal structures are unremarkable.
IMPRESSION: Bilateral patchy airspace opacities are noted, right greater than
left, concerning for multifocal pneumonia. Small right pleural
effusion may be present.

## 2020-10-08 IMAGING — DX DG CHEST 1V PORT
1 series · 1 of 1 positions shown · non-contrast
Comparison: February 15, 2019.

CLINICAL DATA: Shortness of breath, pneumonia.

EXAM:
PORTABLE CHEST 1 VIEW

[chest]
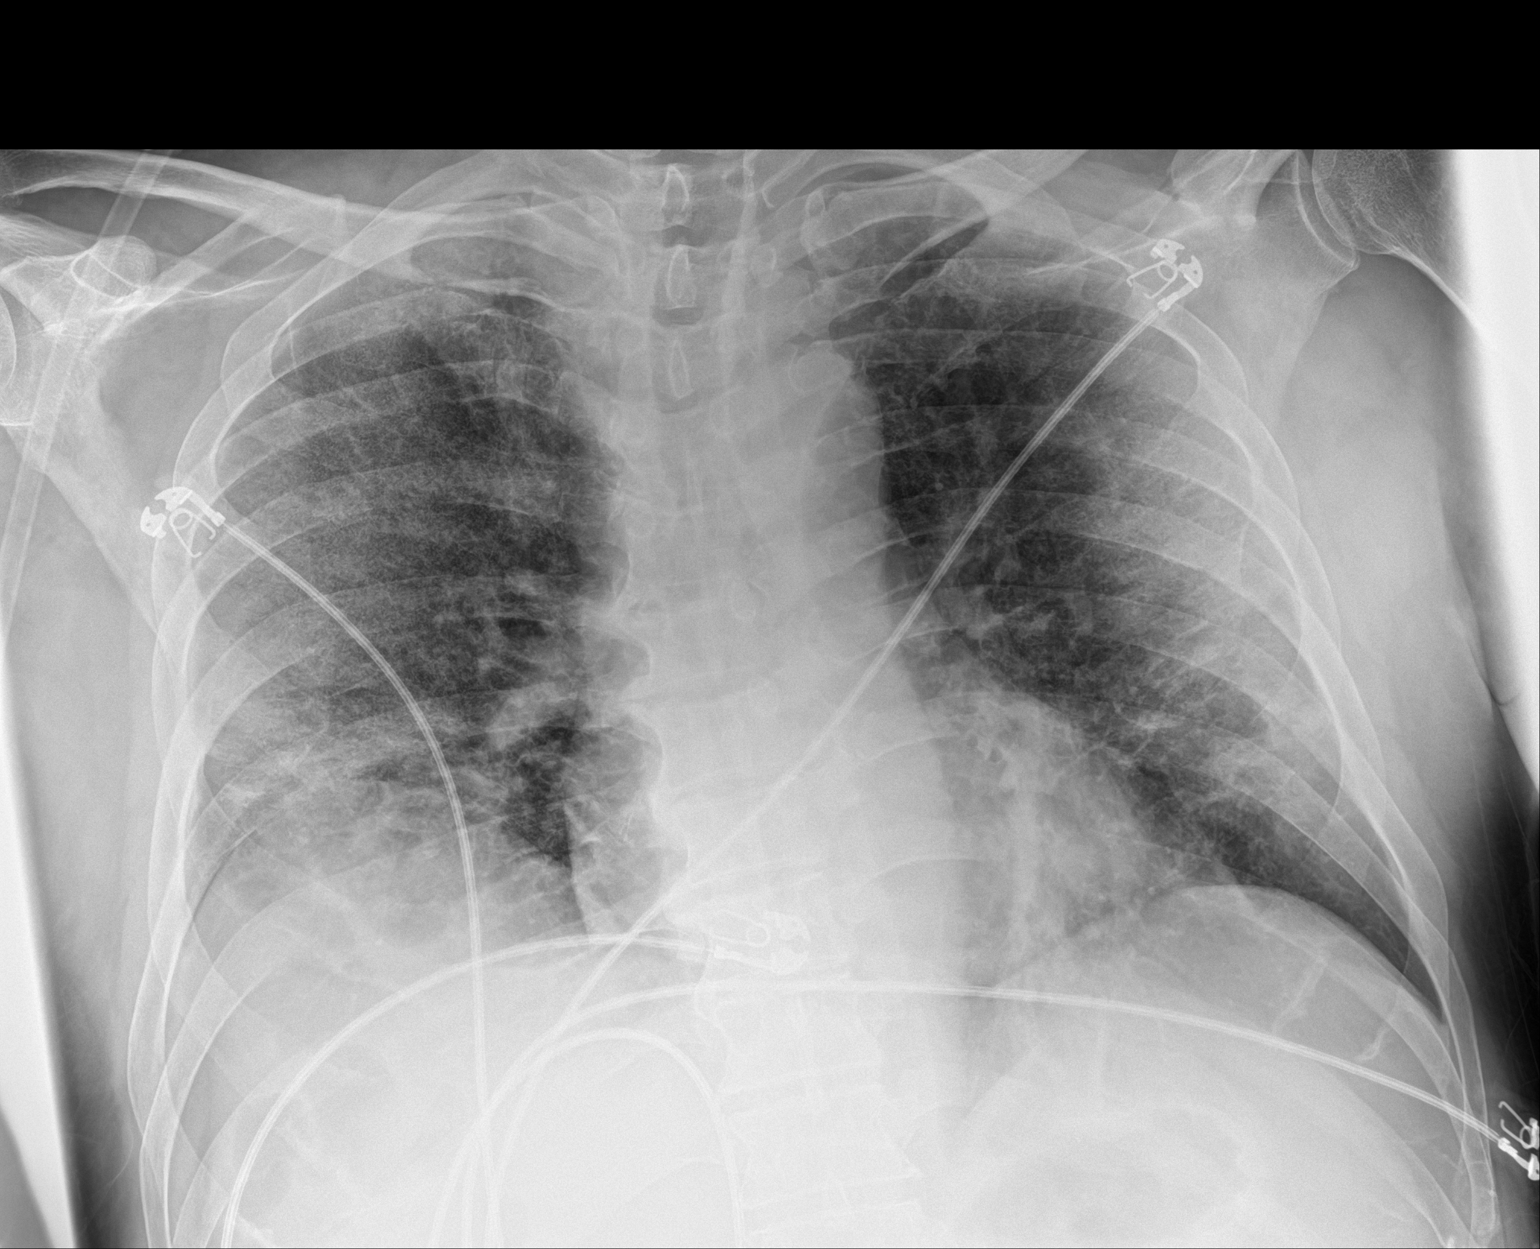

[1 of 1 positions shown; findings below may reference images not displayed]

FINDINGS: The heart size and mediastinal contours are within normal limits.
Stable bilateral peripheral lung opacities are noted. The visualized
skeletal structures are unremarkable.
IMPRESSION: Stable bilateral lung opacities are noted consistent with multifocal
pneumonia.

## 2020-11-06 ENCOUNTER — Ambulatory Visit: Payer: Self-pay | Admitting: Internal Medicine

## 2020-11-06 ENCOUNTER — Encounter: Payer: Self-pay | Admitting: Internal Medicine

## 2020-11-06 ENCOUNTER — Other Ambulatory Visit: Payer: Self-pay

## 2020-11-06 VITALS — BP 132/82 | HR 88 | Resp 20 | Ht 66.5 in | Wt 184.0 lb

## 2020-11-06 DIAGNOSIS — I10 Essential (primary) hypertension: Secondary | ICD-10-CM

## 2020-11-06 DIAGNOSIS — R972 Elevated prostate specific antigen [PSA]: Secondary | ICD-10-CM

## 2020-11-06 DIAGNOSIS — Z23 Encounter for immunization: Secondary | ICD-10-CM

## 2020-11-06 DIAGNOSIS — E782 Mixed hyperlipidemia: Secondary | ICD-10-CM

## 2020-11-06 NOTE — Progress Notes (Signed)
    Subjective:    Patient ID: [name], male   DOB: 13-Mar-1946, 75 y.o.   MRN: [id number]   HPI  His adult son interprets   Hypertension:  taking Lisinopril/HCTZ daily.  No problems.  Walks daily for 1 hour.  Not particularly good with veggie and fruit intake.  His son keeps him honest on this topic today.  2.  Elevated PSA with free PSA pct putting him at a higher risk of CA.  Never called after obtained renewed orange card for referral.  Will send referral again.  PSA has been gradually increasing from 2019.  Denies urinary hesitancy, frequency, or decreased flow.  3.  Hyperlipidemia increased in November.  He is nonfasting today.   4.  COVID vaccination:  he would like his second booster today.  We currently do not have Moderna and he is fine with [employer].  Current Meds  Medication Sig   glucosamine-chondroitin 500-400 MG tablet Take 1 tablet by mouth daily.   lisinopril-hydrochlorothiazide (ZESTORETIC) 20-12.5 MG tablet Take 1 tablet by mouth daily.   No Known Allergies   Review of Systems    Objective:   BP 132/82 (BP Location: Left Arm, Patient Position: Sitting, Cuff Size: Normal)   Pulse 88   Resp 20   Ht 5' 6.5" (1.689 m)   Wt 184 lb (83.5 kg)   BMI 29.25 kg/m   Physical Exam NAD HEENT:  PERRL, EOMI, TMs pearly gray Neck:  Supple, No adenopathy, no thyromegaly. Chest:  CTA CV:  RRR with normal S1 and S2, No S3, S4 or murmur.  No carotid bruit.  Carotid, radial and DP pulses normal and equal Abd:  Protuberant, but soft,  NT, No HSM or mass, + BS LE:  No edema.      Assessment & Plan    Hypertension:  controlled.  CMP in 2 weeks.  Encouraged better eating habits.    2.  Elevated PSA:  re refer to Urology.  Encouraged him to follow through this time.  Repeat PSA/free in 2 weeks with labs  3.  Hyperlipidemia:  recheck FLP with fasting labs in 2 weeks.    4.  HM:  COVID booster with [employer].  To call in end of August for flu vaccine  clinic.  CPE end of November

## 2020-11-06 NOTE — Patient Instructions (Signed)

## 2020-11-13 ENCOUNTER — Other Ambulatory Visit: Payer: Self-pay

## 2020-11-13 DIAGNOSIS — E876 Hypokalemia: Secondary | ICD-10-CM

## 2020-11-13 DIAGNOSIS — R972 Elevated prostate specific antigen [PSA]: Secondary | ICD-10-CM

## 2020-11-13 DIAGNOSIS — E7800 Pure hypercholesterolemia, unspecified: Secondary | ICD-10-CM

## 2020-11-14 LAB — COMPREHENSIVE METABOLIC PANEL
ALT: 27 IU/L (ref 0–44)
AST: 20 IU/L (ref 0–40)
Albumin/Globulin Ratio: 2.6 — ABNORMAL HIGH (ref 1.2–2.2)
Albumin: 4.6 g/dL (ref 3.7–4.7)
Alkaline Phosphatase: 79 IU/L (ref 44–121)
BUN/Creatinine Ratio: 12 (ref 10–24)
BUN: 13 mg/dL (ref 8–27)
Bilirubin Total: 0.7 mg/dL (ref 0.0–1.2)
CO2: 24 mmol/L (ref 20–29)
Calcium: 9.5 mg/dL (ref 8.6–10.2)
Chloride: 105 mmol/L (ref 96–106)
Creatinine, Ser: 1.13 mg/dL (ref 0.76–1.27)
Globulin, Total: 1.8 g/dL (ref 1.5–4.5)
Glucose: 96 mg/dL (ref 65–99)
Potassium: 4.3 mmol/L (ref 3.5–5.2)
Sodium: 141 mmol/L (ref 134–144)
Total Protein: 6.4 g/dL (ref 6.0–8.5)
eGFR: 69 mL/min/{1.73_m2} (ref >59)

## 2020-11-14 LAB — LIPID PANEL W/O CHOL/HDL RATIO
Cholesterol, Total: 166 mg/dL (ref 100–199)
HDL: 51 mg/dL (ref >39)
LDL Chol Calc (NIH): 100 mg/dL — ABNORMAL HIGH (ref 0–99)
Triglycerides: 77 mg/dL (ref 0–149)
VLDL Cholesterol Cal: 15 mg/dL (ref 5–40)

## 2020-11-14 LAB — PSA, TOTAL AND FREE
PSA, Free Pct: 9.5 %
PSA, Free: 1.18 ng/mL
Prostate Specific Ag, Serum: 12.4 ng/mL — ABNORMAL HIGH (ref 0.0–4.0)

## 2020-11-20 ENCOUNTER — Telehealth: Payer: Self-pay

## 2020-11-20 NOTE — Telephone Encounter (Signed)
Pt daughter called to report that [name] as been experiencing memory loss. Issue started about two year ago when Pt got Covid-19 and was hospitalized in order to get him on a respirator. After Pt recovered, family noticed that he was unable to perform daily tasks such as running errands. Daughter believed that it was a short term side effect, but the issue has slowly gotten worse. Pt has trouble remembering things and find himself repeating himself. Would like medication that could help [name].

## 2020-11-20 NOTE — Telephone Encounter (Signed)
Will need an appt next available to test memory and discuss treatment if needed

## 2020-11-29 ENCOUNTER — Telehealth: Payer: Self-pay | Admitting: Internal Medicine

## 2020-11-29 NOTE — Telephone Encounter (Signed)
Pt. Called requesting appointment for redness of eye. Denied pain, discharge or being swollen  Pt. Offered appointment for 2pm but was not able to come in due to daughter's work schedule.  Pt. Requested to be added to wait list

## 2020-12-03 NOTE — Telephone Encounter (Signed)
Scheduled for 12/04/20.

## 2020-12-04 ENCOUNTER — Ambulatory Visit: Payer: Self-pay | Admitting: Internal Medicine

## 2021-03-26 ENCOUNTER — Encounter: Payer: Self-pay | Admitting: Internal Medicine

## 2021-04-03 ENCOUNTER — Other Ambulatory Visit: Payer: Self-pay | Admitting: Internal Medicine

## 2021-04-29 ENCOUNTER — Encounter: Payer: Self-pay | Admitting: Internal Medicine

## 2021-04-29 ENCOUNTER — Ambulatory Visit: Payer: Self-pay | Admitting: Internal Medicine

## 2021-04-29 ENCOUNTER — Other Ambulatory Visit: Payer: Self-pay

## 2021-04-29 VITALS — BP 128/82 | HR 72 | Resp 16 | Ht 66.5 in | Wt 188.5 lb

## 2021-04-29 DIAGNOSIS — R972 Elevated prostate specific antigen [PSA]: Secondary | ICD-10-CM

## 2021-04-29 DIAGNOSIS — Z23 Encounter for immunization: Secondary | ICD-10-CM

## 2021-04-29 DIAGNOSIS — Z0000 Encounter for general adult medical examination without abnormal findings: Secondary | ICD-10-CM

## 2021-04-29 DIAGNOSIS — I10 Essential (primary) hypertension: Secondary | ICD-10-CM

## 2021-04-29 DIAGNOSIS — E782 Mixed hyperlipidemia: Secondary | ICD-10-CM

## 2021-04-29 NOTE — Progress Notes (Signed)
Subjective:    Patient ID: [name], male   DOB: 22-Feb-1946, 76 y.o.   MRN: [id number]   HPI  Son in law, Jacqulyn Bath, interprets  Here for Male CPE:  1.  STE:  Does not perform.  No family history of testicular cancer.    2.  PSA:  History of elevation.  Last check was 11/13/20 with level of 12.4 and free PSA % putting him at higher risk for cancer.  Followed by Urology.  Have had difficult time getting him there as has not kept orange card current and then loses referral.  Will undergo what he describes as ultrasound of prostate with biopsy 1 week from today.   3.  Guaiac Cards/FIT:  Last checked 03/2019 and negative for blood.  4.  Colonoscopy:   Never.  No family history of colon cancer.    5.  Cholesterol/Glucose:  Cholesterol much improved in July from 222 in November in 2021 to 166 in July.  He is not walking as he did in the summer.  Glucose has been fine in past.  6.  Immunizations:   Immunization History  Administered Date(s) Administered   Influenza,inj,Quad PF,6+ Mos 02/03/2019   Influenza-Unspecified 02/03/2019, 12/21/2019, 02/04/2021   Moderna Sars-Covid-2 Vaccination 06/20/2019, 07/18/2019   PFIZER Comirnaty(Gray Top)Covid-19 Tri-Sucrose Vaccine 11/06/2020   Pneumococcal Conjugate-13 03/23/2019   Pneumococcal Polysaccharide-23 05/05/2017   Tdap 05/05/2017     Current Meds  Medication Sig   lisinopril-hydrochlorothiazide (ZESTORETIC) 20-12.5 MG tablet Take 1 tablet by mouth once daily   No Known Allergies  Past Medical History:  Diagnosis Date   Dyslipidemia 2018   Elevated PSA, between 10 and less than 20 ng/ml    Hyperlipidemia 2021   Hypertension 2008   Subconjunctival hemorrhage of left eye 08/03/2019    Past Surgical History:  Procedure Laterality Date   INGUINAL HERNIA REPAIR Bilateral 11/17/2017   Procedure: LAPAROSCOPIC BILATERAL INGUINAL HERNIA REPAIR;  Surgeon: Ralene Ok, MD;  Location: [place];  Service: General;  Laterality:  Bilateral;   INSERTION OF MESH Bilateral 11/17/2017   Procedure: INSERTION OF MESH;  Surgeon: Ralene Ok, MD;  Location: [place];  Service: General;  Laterality: Bilateral;   PROSTATE BIOPSY  2022    Family History  Problem Relation Age of Onset   Hypertension Sister    Hypertension Brother    Hypertension Sister    Hypertension Sister    Stroke Brother    Hypertension Brother    Alcohol abuse Brother    Cirrhosis Brother        Cause of death   Hypertension Brother    Hypertension Brother    Social History   Socioeconomic History   Marital status: Divorced    Spouse name: Not on file   Number of children: 3   Years of education: 27 + 1 year college   Highest education level: Not on file  Occupational History   Occupation: Maintenance/custodial work in past-retired  Tobacco Use   Smoking status: Former    Packs/day: 0.10    Years: 2.00    Pack years: 0.20    Types: Cigarettes   Smokeless tobacco: Never   Tobacco comments:    States was > 20 years ago  Vaping Use   Vaping Use: Never used  Substance and Sexual Activity   Alcohol use: Yes    Alcohol/week: 1.0 standard drink    Types: 1 Cans of beer per week    Comment: occasionally   Drug use:  No   Sexual activity: Not Currently  Other Topics Concern   Not on file  Social History Narrative   From [place]   Lives with daughter, Michelene Heady, her husband and her one son.   Social Determinants of Health   Financial Resource Strain: Low Risk    Difficulty of Paying Living Expenses: Not hard at all  Food Insecurity: No Food Insecurity   Worried About Charity fundraiser in the Last Year: Never true   Arboriculturist in the Last Year: Never true  Transportation Needs: Not on file  Physical Activity: Not on file  Stress: Not on file  Social Connections: Not on file  Intimate Partner Violence: Not At Risk   Fear of Current or Ex-Partner: No   Emotionally Abused: No   Physically Abused: No   Sexually Abused: No      Review of Systems  HENT:  Negative for dental problem (Went to dentist 2 months ago.  Had teeth removed and generally cleaned.  Apparently some cavities filled.).   Respiratory:  Negative for shortness of breath.   Cardiovascular:  Negative for chest pain, palpitations and leg swelling.  Gastrointestinal:  Negative for abdominal pain and blood in stool (no melena).  Genitourinary:  Negative for decreased urine volume, frequency and hematuria.       Nocturia x2 or more each night.     Objective:   BP 128/82 (BP Location: Right Arm, Patient Position: Sitting, Cuff Size: Normal)    Pulse 72    Resp 16    Ht 5' 6.5" (1.689 m)    Wt 188 lb 8 oz (85.5 kg)    BMI 29.97 kg/m   Physical Exam HENT:     Head: Normocephalic and atraumatic.     Right Ear: Tympanic membrane, ear canal and external ear normal.     Left Ear: Tympanic membrane, ear canal and external ear normal.     Nose: Nose normal.     Mouth/Throat:     Mouth: Mucous membranes are moist.     Pharynx: Oropharynx is clear.  Eyes:     Extraocular Movements: Extraocular movements intact.     Conjunctiva/sclera: Conjunctivae normal.     Pupils: Pupils are equal, round, and reactive to light.     Comments: Discs sharp bilaterally.  Neck:     Thyroid: No thyroid mass or thyromegaly.  Cardiovascular:     Rate and Rhythm: Normal rate and regular rhythm.     Heart sounds: S1 normal and S2 normal. No murmur heard.   No friction rub. No S3 or S4 sounds.     Comments: No carotid bruits.  Carotid, radial, femoral, DP and PT pulses normal and equal.    Pulmonary:     Effort: Pulmonary effort is normal.     Breath sounds: Normal breath sounds.  Abdominal:     General: Bowel sounds are normal.     Palpations: Abdomen is soft. There is no hepatomegaly, splenomegaly or mass.     Tenderness: There is no abdominal tenderness.     Hernia: No hernia is present.  Genitourinary:    Comments: Deferred as following currently with  urology. Musculoskeletal:        General: Normal range of motion.     Cervical back: Normal range of motion and neck supple.  Lymphadenopathy:     Head:     Right side of head: No submental or submandibular adenopathy.     Left side of  head: No submental or submandibular adenopathy.     Cervical: No cervical adenopathy.     Upper Body:     Right upper body: No supraclavicular or axillary adenopathy.     Left upper body: No supraclavicular or axillary adenopathy.     Lower Body: No right inguinal adenopathy. No left inguinal adenopathy.  Skin:    General: Skin is warm.     Capillary Refill: Capillary refill takes less than 2 seconds.     Comments: Warty lesion, left forearm  Neurological:     General: No focal deficit present.     Mental Status: He is alert and oriented to person, place, and time.     Cranial Nerves: Cranial nerves 2-12 are intact.     Sensory: Sensation is intact.     Motor: Motor function is intact.     Coordination: Coordination is intact.     Gait: Gait is intact.     Deep Tendon Reflexes: Reflexes are normal and symmetric.  Psychiatric:        Attention and Perception: Attention normal.        Mood and Affect: Mood normal.        Speech: Speech normal.        Behavior: Behavior normal. Behavior is cooperative.   No GU Exam normal  wart on left forearm  Assessment & Plan    CPE Shingrix and Moderna Bivalent vaccine today. Repeat Shingrix in 2-6 months. FIT to return in 2 weeks.  2.  Elevated PSA:  await urologic evaluation.  3.  Hypercholesterolemia:  lipid panel fine in July with lifestyle changes.  To get back to walking.

## 2021-05-08 ENCOUNTER — Other Ambulatory Visit: Payer: Self-pay

## 2021-05-08 DIAGNOSIS — Z1211 Encounter for screening for malignant neoplasm of colon: Secondary | ICD-10-CM

## 2021-05-08 LAB — POC FIT TEST STOOL: Fecal Occult Blood: NEGATIVE

## 2021-05-21 ENCOUNTER — Telehealth: Payer: Self-pay | Admitting: Internal Medicine

## 2021-05-21 DIAGNOSIS — R9721 Rising PSA following treatment for malignant neoplasm of prostate: Secondary | ICD-10-CM | POA: Insufficient documentation

## 2021-05-21 DIAGNOSIS — C61 Malignant neoplasm of prostate: Secondary | ICD-10-CM

## 2021-05-21 NOTE — Telephone Encounter (Signed)
Received fax request from [facility] to have CT of abd/pelvis with IV contrast and total body bone scan for staging of prostate cancer.

## 2021-06-06 ENCOUNTER — Other Ambulatory Visit (HOSPITAL_COMMUNITY): Payer: Self-pay | Admitting: Urology

## 2021-06-06 ENCOUNTER — Other Ambulatory Visit (HOSPITAL_COMMUNITY): Payer: Self-pay | Admitting: Internal Medicine

## 2021-06-06 DIAGNOSIS — C61 Malignant neoplasm of prostate: Secondary | ICD-10-CM

## 2021-06-18 NOTE — Progress Notes (Signed)
GU Location of Tumor / Histology: Prostate Ca ? ?If Prostate Cancer, Gleason Score is (5 + 3) and PSA is (6.69 as of 08/2017) ? ?Biopsies:  ?Dr. Louis Meckel ? ? ? ? ?Past/Anticipated interventions by urology, if any: NA ? ?Past/Anticipated interventions by medical oncology, if any:  NA ? ?Weight changes, if any:  No ? ?IPSS:  17 ?SHIM:  7 ? ?Bowel/Bladder complaints, if any: No ? ?Nausea/Vomiting, if any:  No ? ?Pain issues, if any:  0/10 ? ?SAFETY ISSUES: ?Prior radiation?  No ?Pacemaker/ICD?  No ?Possible current pregnancy?  Male ?Is the patient on methotrexate?  No ? ?Current Complaints / other details:  Need more information about treatment options. ?

## 2021-06-19 ENCOUNTER — Encounter (HOSPITAL_COMMUNITY)
Admission: RE | Admit: 2021-06-19 | Discharge: 2021-06-19 | Disposition: A | Discharge status: Home / Self Care | Payer: Self-pay | Source: Ambulatory Visit | Attending: Urology | Admitting: Urology

## 2021-06-19 ENCOUNTER — Other Ambulatory Visit: Payer: Self-pay

## 2021-06-19 ENCOUNTER — Ambulatory Visit (HOSPITAL_COMMUNITY)
Admission: RE | Admit: 2021-06-19 | Discharge: 2021-06-19 | Disposition: A | Discharge status: Home / Self Care | Payer: Self-pay | Source: Ambulatory Visit | Attending: Urology | Admitting: Urology

## 2021-06-19 ENCOUNTER — Encounter (HOSPITAL_COMMUNITY): Payer: Self-pay

## 2021-06-19 DIAGNOSIS — C61 Malignant neoplasm of prostate: Secondary | ICD-10-CM | POA: Insufficient documentation

## 2021-06-19 LAB — POCT I-STAT CREATININE: Creatinine, Ser: 1.1 mg/dL (ref 0.61–1.24)

## 2021-06-19 MED ORDER — SODIUM CHLORIDE (PF) 0.9 % IJ SOLN
INTRAMUSCULAR | Status: AC
  Filled 2021-06-19: qty 50

## 2021-06-19 MED ORDER — IOHEXOL 9 MG/ML PO SOLN
1000.0000 mL | ORAL | Status: AC
  Administered 2021-06-19: 1000 mL via ORAL

## 2021-06-19 MED ORDER — IOHEXOL 9 MG/ML PO SOLN
ORAL | Status: AC
  Filled 2021-06-19: qty 1000

## 2021-06-19 MED ORDER — TECHNETIUM TC 99M MEDRONATE IV KIT
20.0000 | PACK | Freq: Once | INTRAVENOUS | Status: AC | PRN
  Administered 2021-06-19: 20 via INTRAVENOUS

## 2021-06-19 MED ORDER — IOHEXOL 300 MG/ML  SOLN
100.0000 mL | Freq: Once | INTRAMUSCULAR | Status: AC | PRN
  Administered 2021-06-19: 100 mL via INTRAVENOUS

## 2021-06-23 NOTE — Progress Notes (Signed)
Radiation Oncology         [phone] ________________________________  Initial Outpatient Consultation  Name: [name] MRN: [id number]  Date: 06/24/2021  DOB: 02-23-1946  JS:[id number], Benjamine Mola, MD  Lucas Mallow, MD   REFERRING PHYSICIAN: Lucas Mallow, MD  DIAGNOSIS: 76 y.o. 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    ICD-10-CM   1. Prostate cancer ([place])  C61       HISTORY OF PRESENT ILLNESS: [name] is a 76 y.o. male who had negative biopsies for PSA os 6.7 in July, 2019.  [place] was out of the of the country [place] an extended time and when he returned, he was noted to have an elevated PSA of 12.4 by his primary care physician, Dr. Amil Amen.  Accordingly, he was referred for evaluation in urology by Dr. Gloriann Loan and proceeded to transrectal ultrasound with 12 biopsies of the prostate on 05/06/21.  The prostate volume measured 78 cc.  Out of 12 core biopsies, 2 were positive.  The maximum Gleason score was 3+5, and this was seen in the left lateral base.   For staging, CT abd/pelvis and bone scan were done on 06/19/21, showing no overt metastatic disease.  The patient reviewed the biopsy results with his urologist and he has kindly been referred today for discussion of potential radiation treatment options.   PREVIOUS RADIATION THERAPY: No  PAST MEDICAL HISTORY:  Past Medical History:  Diagnosis Date   Dyslipidemia 2018   Elevated PSA, between 10 and less than 20 ng/ml    Hyperlipidemia 2021   Hypertension 2008   Subconjunctival hemorrhage of left eye 08/03/2019      PAST SURGICAL HISTORY: Past Surgical History:  Procedure Laterality Date   INGUINAL HERNIA REPAIR Bilateral 11/17/2017   Procedure: LAPAROSCOPIC BILATERAL INGUINAL HERNIA REPAIR;  Surgeon: Ralene Ok, MD;  Location: [place];  Service: General;  Laterality: Bilateral;   INSERTION OF MESH Bilateral 11/17/2017   Procedure:  INSERTION OF MESH;  Surgeon: Ralene Ok, MD;  Location: [place];  Service: General;  Laterality: Bilateral;   PROSTATE BIOPSY  2022    FAMILY HISTORY:  Family History  Problem Relation Age of Onset   Hypertension Sister    Hypertension Brother    Hypertension Sister    Hypertension Sister    Stroke Brother    Hypertension Brother    Alcohol abuse Brother    Cirrhosis Brother        Cause of death   Hypertension Brother    Hypertension Brother     SOCIAL HISTORY:  Social History   Socioeconomic History   Marital status: Divorced    Spouse name: Not on file   Number of children: 3   Years of education: 72 + 1 year college   Highest education level: Not on file  Occupational History   Occupation: Maintenance/custodial work in past-retired  Tobacco Use   Smoking status: Former    Packs/day: 0.10    Years: 2.00    Pack years: 0.20    Types: Cigarettes   Smokeless tobacco: Never   Tobacco comments:    States was > 20 years ago  Vaping Use   Vaping Use: Never used  Substance and Sexual Activity   Alcohol use: Yes    Alcohol/week: 1.0 standard drink    Types: 1 Cans of beer per week    Comment: occasionally   Drug use: No   Sexual  activity: Not Currently  Other Topics Concern   Not on file  Social History Narrative   From [place]   Lives with daughter, Michelene Heady, her husband and her one son.   Social Determinants of Health   Financial Resource Strain: Low Risk    Difficulty of Paying Living Expenses: Not hard at all  Food Insecurity: No Food Insecurity   Worried About Charity fundraiser in the Last Year: Never true   Arboriculturist in the Last Year: Never true  Transportation Needs: Not on file  Physical Activity: Not on file  Stress: Not on file  Social Connections: Not on file  Intimate Partner Violence: Not At Risk   Fear of Current or Ex-Partner: No   Emotionally Abused: No   Physically Abused: No   Sexually Abused: No    ALLERGIES: Patient has no  known allergies.  MEDICATIONS:  Current Outpatient Medications  Medication Sig Dispense Refill   lisinopril-hydrochlorothiazide (ZESTORETIC) 20-12.5 MG tablet Take 1 tablet by mouth once daily 30 tablet 6   glucosamine-chondroitin 500-400 MG tablet Take 1 tablet by mouth daily. (Patient not taking: Reported on 04/29/2021)     No current facility-administered medications for this encounter.    REVIEW OF SYSTEMS:  On review of systems, the patient reports that he is doing well overall. He denies any chest pain, shortness of breath, cough, fevers, chills, night sweats, unintended weight changes. He denies any bowel disturbances, and denies abdominal pain, nausea or vomiting. He denies any new musculoskeletal or joint aches or pains. His IPSS was Total Score: 17, indicating moderate urinary symptoms (Reference 0-7 mild, 8-19 moderate, 20-35 severe).  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 A complete review of systems is obtained and is otherwise negative.   PHYSICAL EXAM:  Wt Readings from Last 3 Encounters:  06/24/21 188 lb 4 oz (85.4 kg)  04/29/21 188 lb 8 oz (85.5 kg)  11/06/20 184 lb (83.5 kg)   Temp Readings from Last 3 Encounters:  06/24/21 (!) 97.2 F (36.2 C) (Temporal)  02/19/19 97.8 F (36.6 C) (Oral)  11/17/17 (!) 97.5 F (36.4 C)   BP Readings from Last 3 Encounters:  06/24/21 (!) 143/86  04/29/21 128/82  11/06/20 132/82   Pulse Readings from Last 3 Encounters:  06/24/21 85  04/29/21 72  11/06/20 88   Pain Assessment Pain Score: 0-No pain/10  In general this is a well appearing gentleman in no acute distress. He's alert and oriented x4 and appropriate throughout the examination. Cardiopulmonary assessment is negative for acute distress, and he exhibits normal effort.    KPS = 100  100 - Normal; no complaints; no evidence of disease. 90   - Able to carry on normal activity; minor  signs or symptoms of disease. 80   - Normal activity with effort; some signs or symptoms of disease. 35   - Cares for self; unable to carry on normal activity or to do active work. 60   - Requires occasional assistance, but is able to care for most of his personal needs. 50   - Requires considerable assistance and frequent medical care. 28   - Disabled; requires special care and assistance. 70   - Severely disabled; hospital admission is indicated although death not imminent. 48   - Very sick; hospital admission necessary; active supportive treatment necessary. 10   - Moribund; fatal processes progressing rapidly. 0     -  Dead  Karnofsky DA, Abelmann [place], Craver LS and [place] [facility] [phone]) The use of the nitrogen mustards in the palliative treatment of carcinoma: with particular reference to bronchogenic carcinoma Cancer 1 634-56  LABORATORY DATA:  Lab Results  Component Value Date   WBC 6.5 03/20/2020   HGB 16.8 03/20/2020   HCT 48.9 03/20/2020   MCV 87 03/20/2020   PLT 226 03/20/2020   Lab Results  Component Value Date   NA 141 11/13/2020   K 4.3 11/13/2020   CL 105 11/13/2020   CO2 24 11/13/2020   Lab Results  Component Value Date   ALT 27 11/13/2020   AST 20 11/13/2020   ALKPHOS 79 11/13/2020   BILITOT 0.7 11/13/2020     RADIOGRAPHY: NM Bone Scan Whole Body  Result Date: 06/20/2021 CLINICAL DATA:  Prostate cancer, knee pain EXAM: NUCLEAR MEDICINE WHOLE BODY BONE SCAN TECHNIQUE: Whole body anterior and posterior images were obtained approximately 3 hours after intravenous injection of radiopharmaceutical. RADIOPHARMACEUTICALS:  19.7 mCi Technetium-43mMDP IV COMPARISON:  06/19/2021 FINDINGS: Anterior and posterior whole body planar images demonstrate physiologic excretion of radiotracer within the kidneys and bladder. Mild radiotracer uptake at the bilateral shoulders, sternoclavicular joints, and wrists consistent with degenerative change. Focal uptake along the right inferior  margin of the sternum near the junction with the xiphoid corresponds to hypertrophic change on recent CT. Nonspecific activity localizing to the left anterior third rib, could be posttraumatic. There is focal intense radiotracer uptake in the region of the right patella, which could be degenerative. This could be an abnormal location for an isolated metastatic lesion. IMPRESSION: 1. Degenerative type activity at the shoulders, sternoclavicular joints, and wrists. 2. Nonspecific focal activity within the left third rib and region of the right patella. Radiographic correlation recommended. These are likely posttraumatic or degenerative changes. 3. Otherwise no evidence of bony metastatic disease. Electronically Signed   By: MRanda NgoM.D.   On: 06/20/2021 23:34   CT ABDOMEN PELVIS W CONTRAST  Result Date: 06/20/2021 CLINICAL DATA:  Prostate cancer staging. EXAM: CT ABDOMEN AND PELVIS WITH CONTRAST TECHNIQUE: Multidetector CT imaging of the abdomen and pelvis was performed using the standard protocol following bolus administration of intravenous contrast. RADIATION DOSE REDUCTION: This exam was performed according to the departmental dose-optimization program which includes automated exposure control, adjustment of the mA and/or kV according to patient size and/or use of iterative reconstruction technique. CONTRAST:  1049mOMNIPAQUE IOHEXOL 300 MG/ML  SOLN COMPARISON:  None. FINDINGS: Lower chest: Mild atelectasis or scarring is noted at the lung bases. Hepatobiliary: Fatty infiltration of the liver is noted. There is scattered hypodensities in the liver, largest measuring 1.2 cm is cystic in attenuation. The gallbladder is with out stones. No biliary ductal dilatation. Pancreas: Unremarkable. No pancreatic ductal dilatation or surrounding inflammatory changes. Spleen: Normal in size without focal abnormality. Adrenals/Urinary Tract: There is thickening of the bilateral adrenal glands without evidence of  discrete nodule. No renal calculus or hydronephrosis bilaterally. There are bilateral renal cysts. Mild renal cortical scarring is noted on the right. The bladder is unremarkable. Stomach/Bowel: Stomach is within normal limits. Appendix appears normal. No evidence of bowel wall thickening, distention, or inflammatory changes. Scattered diverticula are present along the colon without evidence of diverticulitis. Vascular/Lymphatic: Aortic atherosclerosis. No enlarged abdominal or pelvic lymph nodes. Reproductive: The prostate gland is enlarged measuring 6.1 cm in diameter and contains coarse calcifications. No local lymphadenopathy is identified. Other: Fat containing inguinal hernias are noted bilaterally. Surgical clips are  present in the low anterior abdominal wall. There is a fat containing umbilical hernia. Musculoskeletal: Degenerative changes in the thoracolumbar spine. There is bilateral spondylolysis at L5 with mild anterolisthesis at L5-S1. No suspicious sclerotic or destructive lesion is seen within the bones. IMPRESSION: 1. Enlarged prostate gland. No evidence of local or distant metastatic disease. 2. Hepatic steatosis with scattered hypodensities in the liver, statistically most likely representing cysts or hemangiomas. 3. Bilateral renal cysts. 4. Diverticulosis without diverticulitis. 5. Aortic atherosclerosis. Electronically Signed   By: Brett Fairy M.D.   On: 06/20/2021 23:24      IMPRESSION/PLAN: 1. 76 y.o. 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 We discussed the patient's workup and outlined the nature of prostate cancer in this setting. The patient's , Gleason's score puts him into the High risk group. Accordingly, he is eligible for a variety of potential treatment options including ADT in combination 8 weeks of external radiation, 5 weeks of external radiation with an upfront brachytherapy boost, or prostatectomy. We discussed the  available radiation techniques, and focused on the details and logistics of delivery. The patient is not be a candidate for brachytherapy boost with a prostate volume of 78 prior to downsizing from hormone therapy. We discussed that based on his prostate volume, he would require beginning treatment with ADT for at least 3 months to allow for downsizing of the prostate prior to initiating radiotherapy. We discussed and outlined the risks, benefits, short and long-term effects associated with radiotherapy and compared and contrasted these with prostatectomy. We discussed the role of SpaceOAR gel in reducing the rectal toxicity associated with radiotherapy. We also detailed the role of ADT in the treatment of High risk prostate cancer and outlined the associated side effects that could be expected with this therapy.  He appears to have a good understanding of his disease and our treatment recommendations which are of curative intent.  He was encouraged to ask questions that were answered to his stated satisfaction.  At the conclusion of our conversation, the patient is interested in moving forward with prostatectomy.  We personally spent 60 minutes in this encounter including chart review, reviewing radiological studies, meeting face-to-face with the patient, entering orders and completing documentation.      Tyler Pita, MD  [facility]   Radiation Oncology Direct Dial: [phone]   Fax: [phone] [email or web address]   Skype   LinkedIn

## 2021-06-24 ENCOUNTER — Ambulatory Visit
Admission: RE | Admit: 2021-06-24 | Discharge: 2021-06-24 | Disposition: A | Discharge status: Home / Self Care | Payer: Self-pay | Source: Ambulatory Visit | Attending: Radiation Oncology | Admitting: Radiation Oncology

## 2021-06-24 ENCOUNTER — Other Ambulatory Visit: Payer: Self-pay

## 2021-06-24 VITALS — BP 143/86 | HR 85 | Temp 97.2°F | Resp 18 | Ht 66.5 in | Wt 188.2 lb

## 2021-06-24 DIAGNOSIS — E785 Hyperlipidemia, unspecified: Secondary | ICD-10-CM | POA: Insufficient documentation

## 2021-06-24 DIAGNOSIS — C61 Malignant neoplasm of prostate: Secondary | ICD-10-CM

## 2021-06-24 DIAGNOSIS — Z87891 Personal history of nicotine dependence: Secondary | ICD-10-CM | POA: Insufficient documentation

## 2021-06-24 DIAGNOSIS — I10 Essential (primary) hypertension: Secondary | ICD-10-CM | POA: Insufficient documentation

## 2021-06-24 NOTE — Progress Notes (Signed)
Introduced myself to the patient, and his daughter, as the prostate nurse navigator.  No barriers to care identified at this time.  He is here to discuss his radiation treatment options, however is leaning towards surgical intervention.  I gave him and his daughter my business card and asked him to call me with questions or concerns.  Verbalized understanding.  ?

## 2021-07-01 ENCOUNTER — Other Ambulatory Visit: Payer: Self-pay | Admitting: Urology

## 2021-07-02 NOTE — Progress Notes (Signed)
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  ? ?Patient has decided to proceed with surgical therapy and is scheduled for 08/02/2021.  ?

## 2021-07-19 NOTE — Patient Instructions (Addendum)
DUE TO COVID-19 ONLY ONE VISITOR  (aged 74 and older)  IS ALLOWED TO COME WITH YOU AND STAY IN THE WAITING ROOM ONLY DURING PRE OP AND PROCEDURE.   ?**NO VISITORS ARE ALLOWED IN THE SHORT STAY AREA OR RECOVERY ROOM!!** ? ?IF YOU WILL BE ADMITTED INTO THE HOSPITAL YOU ARE ALLOWED ONLY TWO SUPPORT PEOPLE DURING VISITATION HOURS ONLY (7 AM -8PM)   ?The support person(s) must pass our screening, gel in and out, and wear a mask at all times, including in the patient?s room. ?Patients must also wear a mask when staff or their support person are in the room. ?Visitors GUEST BADGE MUST BE WORN VISIBLY  ?One adult visitor may remain with you overnight and MUST be in the room by 8 P.M. ?  ? ? Your procedure is scheduled on: 08/02/21 ? ? Report to [facility] Main Entrance ? ?  Report to admitting at: 6:15 AM ? ? Call this number if you have problems the morning of surgery [phone] ? ? CLEAR LIQUIDS DIET STARTING THE DAY BEFORE SURGERY until : 5:30 AM DAY OF SURGERY ? ?Water ?Black Coffee (sugar ok, NO MILK/CREAM OR CREAMERS)  ?Tea (sugar ok, NO MILK/CREAM OR CREAMERS) regular and decaf                             ?Plain Jell-O (NO RED)                                           ?Fruit ices (not with fruit pulp, NO RED)                                     ?Popsicles (NO RED)                                                                  ?Juice: apple, WHITE grape, WHITE cranberry ?Sports drinks like Gatorade (NO RED) ?Clear broth(vegetable,chicken,beef) ? ?FOLLOW BOWEL PREP AND ANY ADDITIONAL PRE OP INSTRUCTIONS YOU RECEIVED FROM YOUR SURGEON'S OFFICE!!! ?  ?Ordered Dose: 17 g Route: Oral Frequency:  Once  ?Admin Dose: 17 g     ?Planned Start: 07/04/21 1415     ?  ?Admin Instructions:  ?Dissolve 17 grams in 4 ounces of water AND DRINK AT [place]  ?    ? ?Ordered Dose: 1 enema Route: Rectal Frequency:  Once  ?Admin Dose: 1 enema     ?Planned Start: 07/04/21 1415     ?  ?Admin Instructions:  ?USE ONE  FLEET ENEMA NIGHT BEFORE SURGERY  ?    ? ?Oral Hygiene is also important to reduce your risk of infection.                                    ?Remember - BRUSH YOUR TEETH THE MORNING OF SURGERY WITH YOUR REGULAR TOOTHPASTE ? ? Do NOT smoke after Midnight ? ? Take these medicines the  morning of surgery with A SIP OF WATER: N/A ? ?DO NOT TAKE ANY ORAL DIABETIC MEDICATIONS DAY OF YOUR SURGERY ? ?Bring CPAP mask and tubing day of surgery. ?                  ?           You may not have any metal on your body including hair pins, jewelry, and body piercing ? ?           Do not wear lotions, powders, perfumes/cologne, or deodorant ? ?            Men may shave face and neck. ? ? Do not bring valuables to the hospital. [facility] NOT ?            RESPONSIBLE   FOR VALUABLES. ? ? Contacts, dentures or bridgework may not be worn into surgery. ? ? Bring small overnight bag day of surgery. ?  ? Patients discharged on the day of surgery will not be allowed to drive home.  Someone NEEDS to stay with you for the first 24 hours after anesthesia. ? ? Special Instructions: Bring a copy of your healthcare power of attorney and living will documents         the day of surgery if you haven't scanned them before. ? ?            Please read over the following fact sheets you were given: IF [place] [phone] ? ?   [facility] - Preparing for Surgery ?Before surgery, you can play an important role.  Because skin is not sterile, your skin needs to be as free of germs as possible.  You can reduce the number of germs on your skin by washing with CHG (chlorahexidine gluconate) soap before surgery.  CHG is an antiseptic cleaner which kills germs and bonds with the skin to continue killing germs even after washing. ?Please DO NOT use if you have an allergy to CHG or antibacterial soaps.  If your skin becomes reddened/irritated stop using the CHG and inform your nurse when you arrive at  Short Stay. ?Do not shave (including legs and underarms) for at least 48 hours prior to the first CHG shower.  You may shave your face/neck. ?Please follow these instructions carefully: ? 1.  Shower with CHG Soap the night before surgery and the  morning of Surgery. ? 2.  If you choose to wash your hair, wash your hair first as usual with your  normal  shampoo. ? 3.  After you shampoo, rinse your hair and body thoroughly to remove the  shampoo.                           4.  Use CHG as you would any other liquid soap.  You can apply chg directly  to the skin and wash  ?                     Gently with a scrungie or clean washcloth. ? 5.  Apply the CHG Soap to your body ONLY FROM THE NECK DOWN.   Do not use on face/ open      ?                     Wound or open sores. Avoid contact with eyes, ears mouth and genitals (  private parts).  ?                     Production manager,  Genitals (private parts) with your normal soap. ?            6.  Wash thoroughly, paying special attention to the area where your surgery  will be performed. ? 7.  Thoroughly rinse your body with warm water from the neck down. ? 8.  DO NOT shower/wash with your normal soap after using and rinsing off  the CHG Soap. ?               9.  Pat yourself dry with a clean towel. ?           10.  Wear clean pajamas. ?           11.  Place clean sheets on your bed the night of your first shower and do not  sleep with pets. ?Day of Surgery : ?Do not apply any lotions/deodorants the morning of surgery.  Please wear clean clothes to the hospital/surgery center. ? ?FAILURE TO FOLLOW THESE INSTRUCTIONS MAY RESULT IN THE CANCELLATION OF YOUR SURGERY ?PATIENT SIGNATURE_________________________________ ? ?NURSE SIGNATURE__________________________________ ? ?________________________________________________________________________  ? ?Incentive Spirometer ? ?An incentive spirometer is a tool that can help keep your lungs clear and active. This tool measures how well you are  filling your lungs with each breath. Taking long deep breaths may help reverse or decrease the chance of developing breathing (pulmonary) problems (especially infection) following: ?A long period of time when you are unable to move or be active. ?BEFORE THE PROCEDURE  ?If the spirometer includes an indicator to show your best effort, your nurse or respiratory therapist will set it to a desired goal. ?If possible, sit up straight or lean slightly forward. Try not to slouch. ?Hold the incentive spirometer in an upright position. ?INSTRUCTIONS FOR USE  ?Sit on the edge of your bed if possible, or sit up as far as you can in bed or on a chair. ?Hold the incentive spirometer in an upright position. ?Breathe out normally. ?Place the mouthpiece in your mouth and seal your lips tightly around it. ?Breathe in slowly and as deeply as possible, raising the piston or the ball toward the top of the column. ?Hold your breath for 3-5 seconds or for as long as possible. Allow the piston or ball to fall to the bottom of the column. ?Remove the mouthpiece from your mouth and breathe out normally. ?Rest for a few seconds and repeat Steps 1 through 7 at least 10 times every 1-2 hours when you are awake. Take your time and take a few normal breaths between deep breaths. ?The spirometer may include an indicator to show your best effort. Use the indicator as a goal to work toward during each repetition. ?After each set of 10 deep breaths, practice coughing to be sure your lungs are clear. If you have an incision (the cut made at the time of surgery), support your incision when coughing by placing a pillow or rolled up towels firmly against it. ?Once you are able to get out of bed, walk around indoors and cough well. You may stop using the incentive spirometer when instructed by your caregiver.  ?RISKS AND COMPLICATIONS ?Take your time so you do not get dizzy or light-headed. ?If you are in pain, you may need to take or ask for pain  medication before doing incentive spirometry. It is harder to  take a deep breath if you are having pain. ?AFTER USE ?Rest and breathe slowly and easily. ?It can be helpful to keep track of a log of your pr

## 2021-07-20 DIAGNOSIS — C61 Malignant neoplasm of prostate: Secondary | ICD-10-CM

## 2021-07-20 HISTORY — DX: Malignant neoplasm of prostate: C61

## 2021-07-22 ENCOUNTER — Encounter (HOSPITAL_COMMUNITY)
Admission: RE | Admit: 2021-07-22 | Discharge: 2021-07-22 | Disposition: A | Discharge status: Home / Self Care | Payer: Self-pay | Source: Ambulatory Visit | Attending: Urology | Admitting: Urology

## 2021-07-22 ENCOUNTER — Encounter (HOSPITAL_COMMUNITY): Payer: Self-pay

## 2021-07-22 ENCOUNTER — Other Ambulatory Visit: Payer: Self-pay

## 2021-07-22 DIAGNOSIS — Z01818 Encounter for other preprocedural examination: Secondary | ICD-10-CM | POA: Insufficient documentation

## 2021-07-22 HISTORY — DX: Pneumonia, unspecified organism: J18.9

## 2021-07-22 HISTORY — DX: Malignant (primary) neoplasm, unspecified: C80.1

## 2021-07-22 LAB — CBC
HCT: 47.3 % (ref 39.0–52.0)
Hemoglobin: 16 g/dL (ref 13.0–17.0)
MCH: 30.9 pg (ref 26.0–34.0)
MCHC: 33.8 g/dL (ref 30.0–36.0)
MCV: 91.3 fL (ref 80.0–100.0)
Platelets: 199 10*3/uL (ref 150–400)
RBC: 5.18 MIL/uL (ref 4.22–5.81)
RDW: 14 % (ref 11.5–15.5)
WBC: 4.5 10*3/uL (ref 4.0–10.5)
nRBC: 0 % (ref 0.0–0.2)

## 2021-07-22 LAB — BASIC METABOLIC PANEL
Anion gap: 6 (ref 5–15)
BUN: 15 mg/dL (ref 8–23)
CO2: 26 mmol/L (ref 22–32)
Calcium: 9.3 mg/dL (ref 8.9–10.3)
Chloride: 105 mmol/L (ref 98–111)
Creatinine, Ser: 1.08 mg/dL (ref 0.61–1.24)
GFR, Estimated: >60 mL/min (ref >60)
Glucose, Bld: 101 mg/dL — ABNORMAL HIGH (ref 70–99)
Potassium: 4 mmol/L (ref 3.5–5.1)
Sodium: 137 mmol/L (ref 135–145)

## 2021-07-22 LAB — TYPE AND SCREEN
ABO/RH(D): O POS
Antibody Screen: NEGATIVE

## 2021-07-22 NOTE — Progress Notes (Addendum)
For Short Stay: ?[place] appointment date: N/A ?Date of COVID positive in last 90 days: N/A ?COVID Vaccine: Pfizer x 2,Moderna x 2: 04/29/21. ?Bowel Prep reminder: Reviewed ? ? ?For Anesthesia: ?PCP - Dr. Mack Hook. ?Cardiologist -  ? ?Chest x-ray -  ?EKG -  ?Stress Test -  ?ECHO -  ?Cardiac Cath -  ?Pacemaker/ICD device last checked: ?Pacemaker orders received: ?Device Rep notified: ? ?Spinal Cord Stimulator: ? ?Sleep Study -  ?CPAP -  ? ?Fasting Blood Sugar -  ?Checks Blood Sugar _____ times a day ?Date and result of last Hgb A1c- ? ?Blood Thinner Instructions: ?Aspirin Instructions: ?Last Dose: ? ?Activity level: Can go up a flight of stairs and activities of daily living without stopping and without chest pain and/or shortness of breath ?  Able to exercise without chest pain and/or shortness of breath ?  Unable to go up a flight of stairs without chest pain and/or shortness of breath ?   ? ?Anesthesia review: Hx: HTN ? ?Patient denies shortness of breath, fever, cough and chest pain at PAT appointment ? ? ?Patient verbalized understanding of instructions that were given to them at the PAT appointment. Patient was also instructed that they will need to review over the PAT instructions again at home before surgery.  ?

## 2021-07-29 ENCOUNTER — Other Ambulatory Visit: Payer: Self-pay | Admitting: Internal Medicine

## 2021-08-01 NOTE — Anesthesia Preprocedure Evaluation (Addendum)
Anesthesia Evaluation  ?Patient identified by MRN, date of birth, ID band ?Patient awake ? ? ? ?Reviewed: ?Allergy & Precautions, H&P , NPO status , Patient's Chart, lab work & pertinent test results ? ?History of Anesthesia Complications ?Negative for: history of anesthetic complications ? ?Airway ?Mallampati: II ? ?TM Distance: >3 FB ?Neck ROM: full ? ? ? Dental ?no notable dental hx. ?(+) Dental Advisory Given ?  ?Pulmonary ?former smoker,  ?  ?Pulmonary exam normal ?breath sounds clear to auscultation ? ? ? ? ? ? Cardiovascular ?Exercise Tolerance: Good ?hypertension, Pt. on medications ? ?Rhythm:regular Rate:Normal ? ? ?  ?Neuro/Psych ?  ? GI/Hepatic ?negative GI ROS, Neg liver ROS,   ?Endo/Other  ?negative endocrine ROS ? Renal/GU ?negative Renal ROS  ? ?  ?Musculoskeletal ? ? Abdominal ?  ?Peds ? Hematology ?negative hematology ROS ?(+)   ?Anesthesia Other Findings ? ? Reproductive/Obstetrics ? ?  ? ? ? ? ? ? ? ? ? ? ? ? ? ?  ?  ? ? ? ? ? ? ? ?Anesthesia Physical ? ?Anesthesia Plan ? ?ASA: 2 ? ?Anesthesia Plan: General  ? ?Post-op Pain Management: Celebrex PO (pre-op)* and Tylenol PO (pre-op)*  ? ?Induction: Intravenous ? ?PONV Risk Score and Plan: 4 or greater and Dexamethasone, Ondansetron, Treatment may vary due to age or medical condition and Diphenhydramine ? ?Airway Management Planned: Oral ETT ? ?Additional Equipment:  ? ?Intra-op Plan:  ? ?Post-operative Plan: Extubation in OR ? ?Informed Consent: I have reviewed the patients History and Physical, chart, labs and discussed the procedure including the risks, benefits and alternatives for the proposed anesthesia with the patient or authorized representative who has indicated his/her understanding and acceptance.  ? ? ? ?Dental advisory given ? ?Plan Discussed with: Surgeon, Anesthesiologist and CRNA ? ?Anesthesia Plan Comments: ( ? ?)  ? ? ? ? ? ?Anesthesia Quick Evaluation ? ?

## 2021-08-01 NOTE — Progress Notes (Signed)
Called patient's daughter, Michelene Heady, about time change for surgery on 05/04/21. Patient to arrive 0530 for 0730 surgery. His daughter verbalizes understanding. ?

## 2021-08-02 ENCOUNTER — Ambulatory Visit (HOSPITAL_BASED_OUTPATIENT_CLINIC_OR_DEPARTMENT_OTHER): Payer: Self-pay | Admitting: Certified Registered"

## 2021-08-02 ENCOUNTER — Encounter (HOSPITAL_COMMUNITY)
Admission: RE | Disposition: A | Discharge status: Home / Self Care | Payer: Self-pay | Source: Home / Self Care | Attending: Urology

## 2021-08-02 ENCOUNTER — Encounter (HOSPITAL_COMMUNITY): Payer: Self-pay | Admitting: Urology

## 2021-08-02 ENCOUNTER — Observation Stay (HOSPITAL_COMMUNITY)
Admission: RE | Admit: 2021-08-02 | Discharge: 2021-08-03 | Disposition: A | Discharge status: Home / Self Care | Payer: Self-pay | Attending: Urology | Admitting: Urology

## 2021-08-02 ENCOUNTER — Ambulatory Visit (HOSPITAL_COMMUNITY): Payer: Self-pay | Admitting: Certified Registered"

## 2021-08-02 ENCOUNTER — Other Ambulatory Visit: Payer: Self-pay

## 2021-08-02 DIAGNOSIS — C61 Malignant neoplasm of prostate: Secondary | ICD-10-CM

## 2021-08-02 DIAGNOSIS — R9721 Rising PSA following treatment for malignant neoplasm of prostate: Secondary | ICD-10-CM | POA: Diagnosis present

## 2021-08-02 DIAGNOSIS — I10 Essential (primary) hypertension: Secondary | ICD-10-CM | POA: Insufficient documentation

## 2021-08-02 DIAGNOSIS — Z8616 Personal history of COVID-19: Secondary | ICD-10-CM | POA: Insufficient documentation

## 2021-08-02 HISTORY — PX: LYMPHADENECTOMY: SHX5960

## 2021-08-02 HISTORY — PX: ROBOT ASSISTED LAPAROSCOPIC RADICAL PROSTATECTOMY: SHX5141

## 2021-08-02 LAB — BASIC METABOLIC PANEL
Anion gap: 8 (ref 5–15)
BUN: 17 mg/dL (ref 8–23)
CO2: 25 mmol/L (ref 22–32)
Calcium: 8.7 mg/dL — ABNORMAL LOW (ref 8.9–10.3)
Chloride: 104 mmol/L (ref 98–111)
Creatinine, Ser: 1.16 mg/dL (ref 0.61–1.24)
GFR, Estimated: >60 mL/min (ref >60)
Glucose, Bld: 175 mg/dL — ABNORMAL HIGH (ref 70–99)
Potassium: 4 mmol/L (ref 3.5–5.1)
Sodium: 137 mmol/L (ref 135–145)

## 2021-08-02 LAB — CBC
HCT: 47 % (ref 39.0–52.0)
Hemoglobin: 15.6 g/dL (ref 13.0–17.0)
MCH: 30.5 pg (ref 26.0–34.0)
MCHC: 33.2 g/dL (ref 30.0–36.0)
MCV: 91.8 fL (ref 80.0–100.0)
Platelets: 198 10*3/uL (ref 150–400)
RBC: 5.12 MIL/uL (ref 4.22–5.81)
RDW: 13.4 % (ref 11.5–15.5)
WBC: 10.7 10*3/uL — ABNORMAL HIGH (ref 4.0–10.5)
nRBC: 0 % (ref 0.0–0.2)

## 2021-08-02 SURGERY — PROSTATECTOMY, RADICAL, ROBOT-ASSISTED, LAPAROSCOPIC
Anesthesia: General | Site: Pelvis

## 2021-08-02 MED ORDER — ONDANSETRON HCL 4 MG/2ML IJ SOLN
INTRAMUSCULAR | Status: AC
  Filled 2021-08-02: qty 2

## 2021-08-02 MED ORDER — KETOROLAC TROMETHAMINE 15 MG/ML IJ SOLN
15.0000 mg | Freq: Four times a day (QID) | INTRAMUSCULAR | Status: DC
  Administered 2021-08-02 – 2021-08-03 (×4): 15 mg via INTRAVENOUS
  Filled 2021-08-02 (×4): qty 1

## 2021-08-02 MED ORDER — DEXAMETHASONE SODIUM PHOSPHATE 10 MG/ML IJ SOLN
INTRAMUSCULAR | Status: DC | PRN
Start: 2021-08-02 — End: 2021-08-02
  Administered 2021-08-02: 8 mg via INTRAVENOUS

## 2021-08-02 MED ORDER — FENTANYL CITRATE PF 50 MCG/ML IJ SOSY
PREFILLED_SYRINGE | INTRAMUSCULAR | Status: AC
  Filled 2021-08-02: qty 3

## 2021-08-02 MED ORDER — DEXAMETHASONE SODIUM PHOSPHATE 10 MG/ML IJ SOLN
INTRAMUSCULAR | Status: AC
  Filled 2021-08-02: qty 1

## 2021-08-02 MED ORDER — ONDANSETRON HCL 4 MG/2ML IJ SOLN
INTRAMUSCULAR | Status: DC | PRN
  Administered 2021-08-02: 4 mg via INTRAVENOUS

## 2021-08-02 MED ORDER — ROCURONIUM BROMIDE 10 MG/ML (PF) SYRINGE
PREFILLED_SYRINGE | INTRAVENOUS | Status: AC
  Filled 2021-08-02: qty 10

## 2021-08-02 MED ORDER — PHENYLEPHRINE HCL (PRESSORS) 10 MG/ML IV SOLN
INTRAVENOUS | Status: AC
  Filled 2021-08-02: qty 1

## 2021-08-02 MED ORDER — DIPHENHYDRAMINE HCL 50 MG/ML IJ SOLN
INTRAMUSCULAR | Status: AC
  Filled 2021-08-02: qty 1

## 2021-08-02 MED ORDER — PROPOFOL 10 MG/ML IV BOLUS
INTRAVENOUS | Status: AC
Start: 2021-08-02 — End: ?
  Filled 2021-08-02: qty 20

## 2021-08-02 MED ORDER — OXYCODONE HCL 5 MG PO TABS
5.0000 mg | ORAL_TABLET | ORAL | Status: DC | PRN
  Administered 2021-08-03: 5 mg via ORAL
  Filled 2021-08-02: qty 1

## 2021-08-02 MED ORDER — GLYCOPYRROLATE 0.2 MG/ML IJ SOLN
INTRAMUSCULAR | Status: DC | PRN
  Administered 2021-08-02: .2 mg via INTRAVENOUS

## 2021-08-02 MED ORDER — LIDOCAINE 2% (20 MG/ML) 5 ML SYRINGE
INTRAMUSCULAR | Status: DC | PRN
Start: 2021-08-02 — End: 2021-08-02
  Administered 2021-08-02: 80 mg via INTRAVENOUS

## 2021-08-02 MED ORDER — FENTANYL CITRATE PF 50 MCG/ML IJ SOSY
25.0000 ug | PREFILLED_SYRINGE | INTRAMUSCULAR | Status: DC | PRN
  Administered 2021-08-02 (×2): 50 ug via INTRAVENOUS

## 2021-08-02 MED ORDER — PROPOFOL 10 MG/ML IV BOLUS
INTRAVENOUS | Status: DC | PRN
  Administered 2021-08-02: 50 mg via INTRAVENOUS
  Administered 2021-08-02: 150 mg via INTRAVENOUS

## 2021-08-02 MED ORDER — SUCCINYLCHOLINE CHLORIDE 200 MG/10ML IV SOSY
PREFILLED_SYRINGE | INTRAVENOUS | Status: AC
  Filled 2021-08-02: qty 10

## 2021-08-02 MED ORDER — CEFAZOLIN SODIUM-DEXTROSE 2-4 GM/100ML-% IV SOLN
2.0000 g | Freq: Once | INTRAVENOUS | Status: AC
  Administered 2021-08-02: 2 g via INTRAVENOUS
  Filled 2021-08-02: qty 100

## 2021-08-02 MED ORDER — FLEET ENEMA 7-19 GM/118ML RE ENEM
1.0000 | ENEMA | Freq: Once | RECTAL | Status: DC
Start: 2021-08-02 — End: 2021-08-02

## 2021-08-02 MED ORDER — ACETAMINOPHEN 10 MG/ML IV SOLN
INTRAVENOUS | Status: DC | PRN
  Administered 2021-08-02: 1000 mg via INTRAVENOUS

## 2021-08-02 MED ORDER — SODIUM CHLORIDE 0.9 % IV BOLUS
1000.0000 mL | Freq: Once | INTRAVENOUS | Status: AC
  Administered 2021-08-02: 1000 mL via INTRAVENOUS

## 2021-08-02 MED ORDER — BUPIVACAINE LIPOSOME 1.3 % IJ SUSP
INTRAMUSCULAR | Status: DC | PRN
  Administered 2021-08-02: 20 mL

## 2021-08-02 MED ORDER — ALBUTEROL SULFATE HFA 108 (90 BASE) MCG/ACT IN AERS
INHALATION_SPRAY | RESPIRATORY_TRACT | Status: AC
  Filled 2021-08-02: qty 6.7

## 2021-08-02 MED ORDER — STERILE WATER FOR IRRIGATION IR SOLN
Status: DC | PRN
  Administered 2021-08-02: 1000 mL

## 2021-08-02 MED ORDER — AMISULPRIDE (ANTIEMETIC) 5 MG/2ML IV SOLN: 10.0000 mg | Freq: Once | INTRAVENOUS | Status: DC | PRN

## 2021-08-02 MED ORDER — FENTANYL CITRATE (PF) 100 MCG/2ML IJ SOLN
INTRAMUSCULAR | Status: AC
  Filled 2021-08-02: qty 2

## 2021-08-02 MED ORDER — FENTANYL CITRATE (PF) 250 MCG/5ML IJ SOLN
INTRAMUSCULAR | Status: AC
  Filled 2021-08-02: qty 5

## 2021-08-02 MED ORDER — ONDANSETRON HCL 4 MG/2ML IJ SOLN: 4.0000 mg | INTRAMUSCULAR | Status: DC | PRN

## 2021-08-02 MED ORDER — ACETAMINOPHEN 500 MG PO TABS
1000.0000 mg | ORAL_TABLET | Freq: Four times a day (QID) | ORAL | Status: DC
  Administered 2021-08-02 – 2021-08-03 (×3): 1000 mg via ORAL
  Filled 2021-08-02 (×4): qty 2

## 2021-08-02 MED ORDER — ACETAMINOPHEN 500 MG PO TABS
1000.0000 mg | ORAL_TABLET | Freq: Once | ORAL | Status: AC
  Administered 2021-08-02: 1000 mg via ORAL
  Filled 2021-08-02: qty 2

## 2021-08-02 MED ORDER — PHENYLEPHRINE HCL-NACL 20-0.9 MG/250ML-% IV SOLN
INTRAVENOUS | Status: DC | PRN
  Administered 2021-08-02: 30 ug/min via INTRAVENOUS

## 2021-08-02 MED ORDER — BUPIVACAINE LIPOSOME 1.3 % IJ SUSP
INTRAMUSCULAR | Status: AC
  Filled 2021-08-02: qty 20

## 2021-08-02 MED ORDER — ROCURONIUM BROMIDE 10 MG/ML (PF) SYRINGE
PREFILLED_SYRINGE | INTRAVENOUS | Status: DC | PRN
  Administered 2021-08-02 (×2): 10 mg via INTRAVENOUS
  Administered 2021-08-02: 30 mg via INTRAVENOUS
  Administered 2021-08-02: 100 mg via INTRAVENOUS
  Administered 2021-08-02: 20 mg via INTRAVENOUS

## 2021-08-02 MED ORDER — SODIUM CHLORIDE (PF) 0.9 % IJ SOLN
INTRAMUSCULAR | Status: AC
  Filled 2021-08-02: qty 20

## 2021-08-02 MED ORDER — LACTATED RINGERS IR SOLN
Status: DC | PRN
  Administered 2021-08-02: 1000 mL

## 2021-08-02 MED ORDER — LACTATED RINGERS IV SOLN: INTRAVENOUS | Status: DC

## 2021-08-02 MED ORDER — ORAL CARE MOUTH RINSE: 15.0000 mL | Freq: Once | OROMUCOSAL | Status: AC

## 2021-08-02 MED ORDER — DEXTROSE-NACL 5-0.45 % IV SOLN: INTRAVENOUS | Status: DC

## 2021-08-02 MED ORDER — CELECOXIB 200 MG PO CAPS
200.0000 mg | ORAL_CAPSULE | Freq: Once | ORAL | Status: AC
  Administered 2021-08-02: 200 mg via ORAL
  Filled 2021-08-02: qty 1

## 2021-08-02 MED ORDER — DIPHENHYDRAMINE HCL 50 MG/ML IJ SOLN
INTRAMUSCULAR | Status: DC | PRN
  Administered 2021-08-02: 12.5 mg via INTRAVENOUS

## 2021-08-02 MED ORDER — CHLORHEXIDINE GLUCONATE 0.12 % MT SOLN
15.0000 mL | Freq: Once | OROMUCOSAL | Status: AC
  Administered 2021-08-02: 15 mL via OROMUCOSAL

## 2021-08-02 MED ORDER — ACETAMINOPHEN 10 MG/ML IV SOLN
INTRAVENOUS | Status: AC
  Filled 2021-08-02: qty 100

## 2021-08-02 MED ORDER — SENNOSIDES-DOCUSATE SODIUM 8.6-50 MG PO TABS
2.0000 | ORAL_TABLET | Freq: Every day | ORAL | Status: DC
  Administered 2021-08-02: 2 via ORAL
  Filled 2021-08-02: qty 2

## 2021-08-02 MED ORDER — EPHEDRINE SULFATE-NACL 50-0.9 MG/10ML-% IV SOSY
PREFILLED_SYRINGE | INTRAVENOUS | Status: DC | PRN
  Administered 2021-08-02 (×2): 5 mg via INTRAVENOUS

## 2021-08-02 MED ORDER — SUGAMMADEX SODIUM 200 MG/2ML IV SOLN
INTRAVENOUS | Status: DC | PRN
  Administered 2021-08-02: 200 mg via INTRAVENOUS

## 2021-08-02 MED ORDER — FENTANYL CITRATE (PF) 100 MCG/2ML IJ SOLN
INTRAMUSCULAR | Status: DC | PRN
  Administered 2021-08-02: 100 ug via INTRAVENOUS
  Administered 2021-08-02: 50 ug via INTRAVENOUS
  Administered 2021-08-02: 100 ug via INTRAVENOUS
  Administered 2021-08-02 (×2): 50 ug via INTRAVENOUS

## 2021-08-02 MED ORDER — HYDROMORPHONE HCL 1 MG/ML IJ SOLN: 0.5000 mg | INTRAMUSCULAR | Status: DC | PRN

## 2021-08-02 MED ORDER — SODIUM CHLORIDE 0.9% FLUSH
INTRAVENOUS | Status: DC | PRN
  Administered 2021-08-02: 20 mL

## 2021-08-02 MED ORDER — POLYETHYLENE GLYCOL 3350 17 G PO PACK: 17.0000 g | PACK | Freq: Once | ORAL | Status: DC

## 2021-08-02 MED ORDER — LACTATED RINGERS IV SOLN: INTRAVENOUS | Status: DC | PRN

## 2021-08-02 SURGICAL SUPPLY — 71 items
APPLICATOR COTTON TIP 6 STRL (MISCELLANEOUS) ×2 IMPLANT
APPLICATOR COTTON TIP 6IN STRL (MISCELLANEOUS) ×3
APPLICATOR SURGIFLO ENDO (HEMOSTASIS) IMPLANT
BAG COUNTER SPONGE SURGICOUNT (BAG) IMPLANT
BAG RETRIEVAL 10 (BASKET) ×1
CATH FOLEY 2WAY SLVR  5CC 18FR (CATHETERS) ×1
CATH FOLEY 2WAY SLVR 5CC 18FR (CATHETERS) ×2 IMPLANT
CATH ROBINSON RED A/P 16FR (CATHETERS) ×3 IMPLANT
CATH SILICONE 5CC 18FR (INSTRUMENTS) ×3 IMPLANT
CHLORAPREP W/TINT 26 (MISCELLANEOUS) ×3 IMPLANT
CLIP LIGATING HEM O LOK PURPLE (MISCELLANEOUS) ×10 IMPLANT
COVER SURGICAL LIGHT HANDLE (MISCELLANEOUS) ×3 IMPLANT
COVER TIP SHEARS 8 DVNC (MISCELLANEOUS) ×2 IMPLANT
COVER TIP SHEARS 8MM DA VINCI (MISCELLANEOUS) ×1
CUTTER ECHEON FLEX ENDO 45 340 (ENDOMECHANICALS) ×1 IMPLANT
DERMABOND ADVANCED (GAUZE/BANDAGES/DRESSINGS) ×1
DERMABOND ADVANCED .7 DNX12 (GAUZE/BANDAGES/DRESSINGS) ×2 IMPLANT
DRAIN CHANNEL RND F F (WOUND CARE) IMPLANT
DRAPE ARM DVNC X/XI (DISPOSABLE) ×8 IMPLANT
DRAPE COLUMN DVNC XI (DISPOSABLE) ×2 IMPLANT
DRAPE DA VINCI XI ARM (DISPOSABLE) ×4
DRAPE DA VINCI XI COLUMN (DISPOSABLE) ×1
DRAPE SURG IRRIG POUCH 19X23 (DRAPES) ×3 IMPLANT
DRSG TEGADERM 2-3/8X2-3/4 SM (GAUZE/BANDAGES/DRESSINGS) ×1 IMPLANT
DRSG TEGADERM 4X4.75 (GAUZE/BANDAGES/DRESSINGS) IMPLANT
ELECT PENCIL ROCKER SW 15FT (MISCELLANEOUS) ×3 IMPLANT
ELECT REM PT RETURN 15FT ADLT (MISCELLANEOUS) ×3 IMPLANT
GAUZE 4X4 16PLY ~~LOC~~+RFID DBL (SPONGE) IMPLANT
GLOVE BIO SURGEON STRL SZ 6.5 (GLOVE) ×3 IMPLANT
GLOVE BIOGEL M 7.0 STRL (GLOVE) ×6 IMPLANT
GLOVE BIOGEL PI IND STRL 7.5 (GLOVE) ×4 IMPLANT
GLOVE BIOGEL PI INDICATOR 7.5 (GLOVE) ×2
HEMOSTAT POWDER SURGIFOAM 1G (HEMOSTASIS) IMPLANT
HOLDER FOLEY CATH W/STRAP (MISCELLANEOUS) ×3 IMPLANT
IRRIG SUCT STRYKERFLOW 2 WTIP (MISCELLANEOUS) ×3
IRRIGATION SUCT STRKRFLW 2 WTP (MISCELLANEOUS) ×2 IMPLANT
IV LACTATED RINGERS 1000ML (IV SOLUTION) ×3 IMPLANT
KIT TURNOVER KIT A (KITS) IMPLANT
MARKER SKIN DUAL TIP RULER LAB (MISCELLANEOUS) ×3 IMPLANT
NDL INSUFFLATION 14GA 120MM (NEEDLE) ×2 IMPLANT
NEEDLE INSUFFLATION 14GA 120MM (NEEDLE) ×3 IMPLANT
PACK ROBOT UROLOGY CUSTOM (CUSTOM PROCEDURE TRAY) ×3 IMPLANT
PROTECTOR NERVE ULNAR (MISCELLANEOUS) ×3 IMPLANT
RELOAD STAPLE 45 4.1 GRN THCK (STAPLE) IMPLANT
SEAL CANN UNIV 5-8 DVNC XI (MISCELLANEOUS) ×8 IMPLANT
SEAL XI 5MM-8MM UNIVERSAL (MISCELLANEOUS) ×4
SET TUBE SMOKE EVAC HIGH FLOW (TUBING) ×3 IMPLANT
SOLUTION ELECTROLUBE (MISCELLANEOUS) ×3 IMPLANT
STAPLE RELOAD 45 GRN (STAPLE) ×2 IMPLANT
STAPLE RELOAD 45MM GREEN (STAPLE) ×1
SURGIFLO W/THROMBIN 8M KIT (HEMOSTASIS) IMPLANT
SUT ETHILON 2 0 PS N (SUTURE) ×1 IMPLANT
SUT MNCRL 3 0 VIOLET RB1 (SUTURE) IMPLANT
SUT MNCRL AB 4-0 PS2 18 (SUTURE) ×6 IMPLANT
SUT MONOCRYL 3 0 RB1 (SUTURE)
SUT PDS AB 0 CT1 36 (SUTURE) ×6 IMPLANT
SUT VIC AB 0 CT1 27 (SUTURE) ×2
SUT VIC AB 0 CT1 27XBRD ANTBC (SUTURE) ×4 IMPLANT
SUT VIC AB 2-0 SH 27 (SUTURE) ×1
SUT VIC AB 2-0 SH 27XBRD (SUTURE) ×2 IMPLANT
SUT VIC AB 3-0 SH 27 (SUTURE) ×1
SUT VIC AB 3-0 SH 27X BRD (SUTURE) IMPLANT
SUT VIC AB 4-0 RB1 27 (SUTURE)
SUT VIC AB 4-0 RB1 27XBRD (SUTURE) IMPLANT
SUT VLOC 3-0 9IN GRN (SUTURE) ×1 IMPLANT
SUT VLOC BARB 180 ABS3/0GR12 (SUTURE) ×9
SUTURE VLOC BRB 180 ABS3/0GR12 (SUTURE) ×4 IMPLANT
SYS BAG RETRIEVAL 10MM (BASKET) ×2
SYSTEM BAG RETRIEVAL 10MM (BASKET) IMPLANT
TOWEL OR NON WOVEN STRL DISP B (DISPOSABLE) ×3 IMPLANT
WATER STERILE IRR 1000ML POUR (IV SOLUTION) ×3 IMPLANT

## 2021-08-02 NOTE — H&P (Addendum)
The H&P was reviewed below with the patient in person this morning with no significant changes documented. We will proceed with robotic radical prostatectomy as scheduled.  ? ?Darrell Mosher, MD ?[facility] Urology Resident, PGY4 ?[facility] ? ?I have seen and examined the patient and agree with the above assessment and plan. ? ?Darrell R. Darnell Jeschke MD ?[facility]  ?Pager: [phone] ? ? ?Office Visit Report     07/18/2021  ? ?  ?CC/HPI: Patient has been previously seen for elevated PSA.  ?He had a prior prostate biopsy 10/2017 at that time his PSA was 6.7. Pathology was benign.  ?[place] and saw his PCP for the first time since then in July.  ?PSA on 11/13/2020 was 12.4 . He reports no urinary symptoms at time of this PSA test.  ?Patient reports nocturia x 2 nightly. States he has noticed he has more frequency than prior.  ?Denies hematuria, dysuria, feelings to be emptying well.  ?PSA was 68g on previous biopsy.  ? ?Interval 05/16/21: Patient is s/p Prostate biopsy revealing Gleason 3+5=8 and Gleason 4+3 =7 (respectively) prostate cancer in 2/12 cores in left base. He tolerated it well. Here today for discussion of next steps.  ? ?Interval 06/27/21: Patient is here for follow up. His bone scan and CT were negative for metastasis. He met with Radiation Oncology and discussed different radiation options. He is not interested in radiation. He wants to pursue surgery.  ? ?Prostate Volume - 78 cc  ?Biopsy results (05/06/2021): Gleason 3+5=8, Gleason 4+3=7 (2/12 cores, Left Base)  ?PSA (10/2020): 12.4  ? ?He endorses a prior BILATERAL laparoscopic inguinal hernia repair with mesh (2019, Dr. Ralene Oneal). He denies any additional abdominal surgery. He has a history of HTN, but otherwise denies history of heart disease, lung disease, or prior blood clots. He does not endorse chest pain and significant SOB with activity. He does not currently take any anticoagulation.  ? ?Interval  07/18/21: Patient her for preop appointment for upcoming prostatectomy with Dr. Abner Oneal. He has been well since he was last seen. He denies chest pain with activity. He denies SOB with activity such as walking a block or up and down stairs.  ? ?  ?ALLERGIES: Nkda ?  ? ?MEDICATIONS: Losartan-Hydrochlorothiazide  ?  ? ?GU PSH: Prostate Needle Biopsy - 05/06/2021, 2019, 2019 ? ?  ? ?NON-GU PSH: Surgical Pathology, Gross And Microscopic Examination For Prostate Needle - 05/06/2021 ? ?  ? ?GU PMH: Prostate Cancer - 06/27/2021, - 05/16/2021 ?Elevated PSA - 05/16/2021, - 05/06/2021, - 02/28/2021, - 2019 ?Unil Inguinal Hernia W/O obst or gang,recurrent - 2019 ?  ? ?NON-GU PMH: Hypertension ?  ? ?FAMILY HISTORY: None  ? ?SOCIAL HISTORY: Marital Status: Married ?Preferred Language: Spanish; Castilian; Race: Other Race ?Current Smoking Status: Patient does not smoke anymore.  ? ?Tobacco Use Assessment Completed: Used Tobacco in last 30 days? ?Social Drinker.  ?Drinks 1 caffeinated drink per day. ?  ? ?REVIEW OF SYSTEMS:    ?GU Review Male:   Patient denies frequent urination, hard to postpone urination, burning/ pain with urination, get up at night to urinate, leakage of urine, stream starts and stops, trouble starting your stream, have to strain to urinate , erection problems, and penile pain.  ?Gastrointestinal (Upper):   Patient denies nausea, vomiting, and indigestion/ heartburn.  ?Gastrointestinal (Lower):   Patient denies constipation and diarrhea.  ?Constitutional:   Patient denies fever, night sweats, weight loss, and fatigue.  ?  Skin:   Patient denies skin rash/ lesion and itching.  ?Eyes:   Patient denies blurred vision and double vision.  ?Ears/ Nose/ Throat:   Patient denies sore throat and sinus problems.  ?Hematologic/Lymphatic:   Patient denies swollen glands and easy bruising.  ?Cardiovascular:   Patient denies leg swelling and chest pains.  ?Respiratory:   Patient denies cough and shortness of breath.  ?Endocrine:    Patient denies excessive thirst.  ?Musculoskeletal:   Patient denies back pain and joint pain.  ?Neurological:   Patient denies headaches and dizziness.  ?Psychologic:   Patient denies depression and anxiety.  ? ?VITAL SIGNS:    ?  07/18/2021 03:15 PM  ?Weight 170 lb / 77.11 kg  ?Height 66 in / 167.64 cm  ?BP 149/84 mmHg  ?Heart Rate 101 /min  ?Temperature 99.1 F / 37.2 C  ?BMI 27.4 kg/m?  ? ?MULTI-SYSTEM PHYSICAL EXAMINATION:    ?Constitutional: Well-nourished. No physical deformities. Normally developed. Good grooming.  ?Neck: Neck symmetrical, not swollen. Normal tracheal position.  ?Respiratory: No labored breathing, no use of accessory muscles.   ?Cardiovascular: Normal temperature, normal extremity pulses, no swelling, no varicosities.  ?Lymphatic: No enlargement of neck, axillae, groin.  ?Skin: No paleness, no jaundice, no cyanosis. No lesion, no ulcer, no rash.  ?Neurologic / Psychiatric: Oriented to time, oriented to place, oriented to person. No depression, no anxiety, no agitation.  ?Gastrointestinal: No mass, no tenderness, no rigidity, non obese abdomen.  ?Eyes: Normal conjunctivae. Normal eyelids.  ?Ears, Nose, Mouth, and Throat: Left ear no scars, no lesions, no masses. Right ear no scars, no lesions, no masses. Nose no scars, no lesions, no masses. Normal hearing. Normal lips.  ?Musculoskeletal: Normal gait and station of head and neck.  ? ?  ?Complexity of Data:  ? 09/17/17  ?PSA  ?Total PSA 6.69 ng/mL  ?Free PSA 1.28 ng/mL  ?% Free PSA 19 % PSA  ? ? ?PROCEDURES:    ? ?     Urinalysis ?Dipstick Dipstick Cont'd  ?Color: Yellow Bilirubin: Neg mg/dL  ?Appearance: Clear Ketones: Neg mg/dL  ?Specific Gravity: 1.020 Blood: Neg ery/uL  ?pH: 7.0 Protein: Neg mg/dL  ?Glucose: Neg mg/dL Urobilinogen: 0.2 mg/dL  ?  Nitrites: Neg  ?  Leukocyte Esterase: Neg leu/uL  ? ? ?ASSESSMENT:  ?    ICD-10 Details  ?1 GU:   Prostate Cancer - C61   ?        Notes:   87yoM with newly diagnosed High risk prostate cancer here  for his pre-op appointment. He has had staging imaging which was negative for metastatic disease. He has seen radiation oncology and prefers to proceed with surgery. Once again we discussed robotic radical prostatectomy today in clinic. Described the procedure in detail and typically post-operative care/hospital course. Discussed the risks in detail including bleeding, infection, and damage to surrounding structures. Discussed common long term complications including erectile dysfunction, urinary incontinence, and bladder neck contracture, as well as the typical natural history of these issues. All patient questions were answered to his satisfaction.  ? ?He is scheduled for robotic radical prostatectomy with bilateral pelvic lymph node dissection on 08/02/21. He has an upcoming appointment for labs, so we will not obtain those today.  ? ? ?PLAN:    ? ?      Document ?Letter(s):  Created for Patient: Clinical Summary  ? ? ?     Notes:   Visit supervised by Dr. Gloriann Loan  ? ?  Next Appointment:    ?  Next Appointment: 08/02/2021 08:30 AM  ?  Appointment Type: Surgery   ?  Location: [facility] [phone]  ?  Provider: Rexene Alberts, M.D.  ?  Reason for Visit: [facility]/EXT REC RA LAP RAD PROSTATECTOMY, BPLA WITH AMANDA AND RESIDENT  ?  ? ? ?E & M CODES: We spent 20 minutes dedicated to evaluation and management time, including face to face interaction, discussions on coordination of care, documentation, result review, and discussion with others as applicable.   ? ?    ?

## 2021-08-02 NOTE — Transfer of Care (Signed)
Immediate Anesthesia Transfer of Care Note ? ?Patient: [name] ? ?Procedure(s) Performed: XI ROBOTIC ASSISTED LAPAROSCOPIC RADICAL PROSTATECTOMY (Pelvis) ?LYMPHADENECTOMY, PELVIC (Bilateral: Pelvis) ? ?Patient Location: PACU ? ?Anesthesia Type:General ? ?Level of Consciousness: awake, alert , oriented and patient cooperative ? ?Airway & Oxygen Therapy: Patient Spontanous Breathing and Patient connected to face mask oxygen ? ?Post-op Assessment: Report given to RN and Post -op Vital signs reviewed and stable ? ?Post vital signs: Reviewed and stable ? ?Last Vitals:  ?Vitals Value Taken Time  ?BP 145/89 08/02/21 1337  ?Temp    ?Pulse 83 08/02/21 1340  ?Resp 13 08/02/21 1340  ?SpO2 99 % 08/02/21 1340  ?Vitals shown include unvalidated device data. ? ?Last Pain:  ?Vitals:  ? 08/02/21 0544  ?TempSrc:   ?PainSc: 0-No pain  ?   ? ?  ? ?Complications: No notable events documented. ?

## 2021-08-02 NOTE — Plan of Care (Signed)
  Problem: Education: Goal: Knowledge of General Education information will improve Description: Including pain rating scale, medication(s)/side effects and non-pharmacologic comfort measures Outcome: Progressing   Problem: Health Behavior/Discharge Planning: Goal: Ability to manage health-related needs will improve Outcome: Progressing   Problem: Activity: Goal: Risk for activity intolerance will decrease Outcome: Progressing   

## 2021-08-02 NOTE — Anesthesia Postprocedure Evaluation (Signed)
Anesthesia Post Note ? ?Patient: [name] ? ?Procedure(s) Performed: XI ROBOTIC ASSISTED LAPAROSCOPIC RADICAL PROSTATECTOMY (Pelvis) ?LYMPHADENECTOMY, PELVIC (Bilateral: Pelvis) ? ?  ? ?Patient location during evaluation: PACU ?Anesthesia Type: General ?Level of consciousness: sedated ?Pain management: pain level controlled ?Vital Signs Assessment: post-procedure vital signs reviewed and stable ?Respiratory status: spontaneous breathing and respiratory function stable ?Cardiovascular status: stable ?Postop Assessment: no apparent nausea or vomiting ?Anesthetic complications: no ? ? ?No notable events documented. ? ?Last Vitals:  ?Vitals:  ? 08/02/21 1415 08/02/21 1430  ?BP: (!) 145/98 (!) 141/91  ?Pulse: 82 88  ?Resp: 10 13  ?Temp:    ?SpO2: 97% 100%  ?  ?Last Pain:  ?Vitals:  ? 08/02/21 1430  ?TempSrc:   ?PainSc: Asleep  ? ? ?  ?  ?  ?  ?  ?  ? ?[name] ? ? ? ? ?

## 2021-08-02 NOTE — Anesthesia Procedure Notes (Addendum)
Procedure Name: Intubation ?Date/Time: 08/02/2021 7:38 AM ?Performed by: Cleda Daub, CRNA ?Pre-anesthesia Checklist: Patient identified, Emergency Drugs available, Suction available and Patient being monitored ?Patient Re-evaluated:Patient Re-evaluated prior to induction ?Oxygen Delivery Method: Circle system utilized ?Preoxygenation: Pre-oxygenation with 100% oxygen ?Induction Type: IV induction ?Ventilation: Mask ventilation without difficulty ?Laryngoscope Size: Mac and 4 ?Grade View: Grade I ?Tube type: Oral ?Number of attempts: 2 ?Airway Equipment and Method: Stylet and Oral airway ?Placement Confirmation: ETT inserted through vocal cords under direct vision, positive ETCO2 and breath sounds checked- equal and bilateral ?Secured at: 21 cm ?Tube secured with: Tape ?Dental Injury: Injury to lip  ?Comments: A minor chip on the lower lip; ointment applied; will reassess during extubation.  ? ? ? ? ?

## 2021-08-02 NOTE — Op Note (Addendum)
Operative Note ? ?Preoperative diagnosis:  ?1.  Localized prostate cancer ? ?Postoperative diagnosis: ?1.  Localized prostate cancer ? ?Procedure(s): ?1.  Robotic assisted laparoscopic radical prostatectomy (without nerve sparing) ?2.  Robotic assisted laparoscopic bilateral pelvic lymph node dissection ? ?Surgeon: Rexene Alberts, MD ? ?Assistants:   ?Debbrah Alar, PA ? ?An assistant was required for this surgical procedure.  The duties of the assistant included but were not limited to suctioning, passing suture, camera manipulation, retraction.  This procedure would not be able to be performed without an Environmental consultant.  ? ?Resident: ?Reola Mosher, PGY-4 ? ?Anesthesia:  General ? ?Complications:  None ? ?EBL:  50 mL ? ?Specimens: ?1.  Prostate with seminal vesicles ?2.  Periprostatic fat ?3.  Bilateral pelvic lymph nodes ? ?Drains/Catheters: ?1.  41 French Foley catheter ? ?Intraoperative findings:   ?Approximately 70 g prostate.  No accessory pudendal vessels.  Wide dissection bilaterally without nerve spare.  Excellent hemostasis.  Water-tight anastomosis without leak. ? ?Indication:  [name] is a 76 y.o. male who initially presented with an elevated PSA.  Prostate biopsy showed Gleason 3+5 prostate cancer involving the left side of the prostate.  Treatment options were discussed with him at length and he chose robotic assisted laparoscopic radical prostatectomy.  He has high grade disease and the plan is for non-nerve sparing.  Bilateral pelvic lymph node dissection was planned due to his risk stratification. ? ?Description of procedure: ?The indications, alternatives, benefits, and risks were discussed with the patient and informed symptoms obtained.  The patient was brought to the operating room table, positioned supine and secured to the bed with a safety strap.  All pressure points were carefully padded and pneumatic compression devices were placed on lower extremities.  After the administration of  intravenous antibiotics, subcutaneous heparin preoperatively  and general endotracheal anesthesia, the patient was repositioned in the dorsal lithotomy position using well-padded Allen stirrups.  The arms were carefully tucked at the patient's side and secured with padding.  The chest was secured in place with foam padding and cloth tape and the table was positioned in approximately 30 degree Trendelenburg.  A rectal exam showed the prostate to be 70cc, without nodules and not fixed. The patient's abdomen, genitalia, and upper thighs were prepped and draped in the standard sterile manner.  A time was completed, verifying the correct patient, surgical procedure and positioning prior to beginning the procedure.  An 47 French urethral catheter was inserted to drain the bladder. ? ?Pneumoperitoneum was introduced by placing a Veress needle into the abdomen superior to the umbilicus and insufflated with CO2 to a pressure of 15 mmHg. An 8 mm blunt tip trocar was placed just above the umbilicus.  The 0 degree camera was then passed under direct visualization.  The abdominal cavity was examined for any sign of injury, adhesions, and identification of anatomic landmarks.  The remainder of the trochars were placed which included 2 separate 8 millimeter robotic trochars which were placed 9 cm laterally and inferiorly to the initially placed camera trocar.  A 12 mm trocar was placed 8 mm lateral to the right robotic trocar.  A separate 8 mm robotic trocar was placed 8 cm lateral to the previously placed left robotic trocar on the left side.  A 5 mm trocar was placed to the right and well above the umbilicus which approximately 10 and 12 cm away from the right-sided trochars.  The robot was then docked. We placed monopolar scissors in the right hand, a  fenestrated bipolar in the left hand and a prograsp in the fourth arm. ? ?We began with a posterior approach. The peritoneal reflection was inspected and the course of the vasa  deferentia were visualized below the bladder.  An incision was made in the peritoneum and the vasa were identified and dissected.  The seminal vesicles were identified and dissected out using a combination of electrocauterization and sharp dissection. ? ?The urachus and median umbilical ligament was divided and we developed the space of Retzius down to the pubic bone.  This was somewhat difficult due to the inflamed and sticky tissue related to the bilateral mesh from his previous bilateral inguinal hernia repair. I divided the parietal peritoneum laterally up to the vas deferens on each side.  Using the prograsp forcep to provide cranial traction on the urachus, the prostate was then defatted above the prostatic vesicle junction, and the superficial dorsal venous complex was coagulated with bipolar and divided.  We submitted the periprostatic fat for pathological specimen.  The endopelvic fascia was sharply opened bilaterally and the levator muscle fibers were swept posterior laterally allowing for visualization of the deep dorsal venous complex and apex of the prostate.  The puboprostatic ligaments were sharply divided and care was taken to preserve the dorsal venous complex.  I secured the dorsal venous complex with a stapler load. ? ?I then addressed the bladder neck with a 30 degree down lens. I identified the bladder neck by pulling a Foley catheter.  The fourth arm was applying cranial traction on the urachus.  I divided the anterior bladder neck musculature until I found the anterior bladder neck mucosa which was then incised. I identified the Foley catheter within, the balloon was deflated, we pulled the Foley catheter out into the operating field with the fourth arm. ? ?I then divided the lateral bladder neck mucosa in the posterior bladder neck mucosa.  I was well away from the bilateral ureteral orifices.  I divided the posterior bladder neck musculature until I discovered the longitudinal fibers and  kept dissecting until I found the previously dissected vas deferens and seminal vesicles.  ? ?I then switched back to a 0 degree lens.  Elevating the seminal vesicles and vas deferens, I divided the Denonvilliers fascia beneath the prostate and developed the prostate off the rectum.  This plane was bluntly developed towards the apex of the prostate.  I then addressed the pedicles, first starting with the right and then moving towards the left.  We did not perform a nerve spare. I then isolated the pedicles of the prostate and placed Weck clips on the pedicles and then divided the pedicles with cold scissors.  I continued to divide out to the apex of the prostate.  At this point the prostate was essentially freed up except for the urethra.  ? ?The anterior urethra was then sharply divided with cold scissors.  The Foley catheter was then pulled back and we divided the posterior urethral wall. The specimen was placed in the upper abdomen out of the way.  I then irrigated the pelvis.  We performed a rectal test by instilling air into the rectal Foley.  The test was negative.  There is no concern for rectal injury.  ? ?I then performed a bilateral pelvic lymph node dissection by incising the fascia overlying the right external iliac vein, dissecting distally.  I went just distal to the node of [place] were replaced clips and then divided the lymphatics.  The lateral aspect of  the dissection was the pelvic sidewall, inferior was the obturator nerve and proximal of the hypogastric vessels.  I placed clips at the proximal aspect and then divided the lymphatics.  The specimen was removed with the scope grasper and sent to pathology.  Grossly, there were no enlarged lymph nodes.  This was performed on both the right and left pelvic lymph nodes. ? ?With good hemostasis confirmed, I then did the posterior reconstruction with a Rocco stitch.  I used a 3-0 Vloc double-armed suture on an RB1 needle.  I passed the sutures through  the cut edges of Denonvilliers fascia beneath the bladder on the right side and through the posterior serrated sphincter underneath the urethra.  I ran this from right to left.  And then took the other end of

## 2021-08-03 ENCOUNTER — Encounter (HOSPITAL_COMMUNITY): Payer: Self-pay | Admitting: Urology

## 2021-08-03 LAB — BASIC METABOLIC PANEL
Anion gap: 5 (ref 5–15)
BUN: 15 mg/dL (ref 8–23)
CO2: 25 mmol/L (ref 22–32)
Calcium: 8.8 mg/dL — ABNORMAL LOW (ref 8.9–10.3)
Chloride: 109 mmol/L (ref 98–111)
Creatinine, Ser: 1.13 mg/dL (ref 0.61–1.24)
GFR, Estimated: >60 mL/min (ref >60)
Glucose, Bld: 160 mg/dL — ABNORMAL HIGH (ref 70–99)
Potassium: 4.1 mmol/L (ref 3.5–5.1)
Sodium: 139 mmol/L (ref 135–145)

## 2021-08-03 LAB — CBC
HCT: 39.4 % (ref 39.0–52.0)
Hemoglobin: 13.4 g/dL (ref 13.0–17.0)
MCH: 30.7 pg (ref 26.0–34.0)
MCHC: 34 g/dL (ref 30.0–36.0)
MCV: 90.2 fL (ref 80.0–100.0)
Platelets: 185 10*3/uL (ref 150–400)
RBC: 4.37 MIL/uL (ref 4.22–5.81)
RDW: 13.6 % (ref 11.5–15.5)
WBC: 8.3 10*3/uL (ref 4.0–10.5)
nRBC: 0 % (ref 0.0–0.2)

## 2021-08-03 MED ORDER — OXYCODONE-ACETAMINOPHEN 5-325 MG PO TABS: 1.0000 | ORAL_TABLET | ORAL | 0 refills | Status: AC | PRN

## 2021-08-03 MED ORDER — SENNOSIDES-DOCUSATE SODIUM 8.6-50 MG PO TABS: 1.0000 | ORAL_TABLET | Freq: Every day | ORAL | 0 refills | Status: AC

## 2021-08-03 MED ORDER — CHLORHEXIDINE GLUCONATE CLOTH 2 % EX PADS
6.0000 | MEDICATED_PAD | Freq: Every day | CUTANEOUS | Status: DC
  Administered 2021-08-03: 6 via TOPICAL

## 2021-08-03 NOTE — Discharge Summary (Signed)
Date of admission: 08/02/2021 ? ?Date of discharge: 08/03/2021 ? ?Admission diagnosis: Prostate cancer ? ?Discharge diagnosis: Prostate cancer ? ?Secondary diagnoses:  ?Patient Active Problem List  ? Diagnosis Date Noted  ? Prostate cancer ([place]) 05/21/2021  ? Hyperlipidemia 2021  ? Hypokalemia 02/16/2019  ? Pneumonia due to COVID-19 virus 02/15/2019  ? Elevated PSA, less than 10 ng/ml 11/12/2018  ? Dental decay 06/29/2017  ? Left inguinal hernia 06/29/2017  ? Overweight (BMI 25.0-29.9) 04/28/2017  ? Dyslipidemia 04/21/2016  ? Essential hypertension 04/21/2006  ? ? ?Procedures performed: ?Procedure(s): ?XI ROBOTIC ASSISTED LAPAROSCOPIC RADICAL PROSTATECTOMY ?LYMPHADENECTOMY, PELVIC ? ?History and Physical: For full details, please see admission history and physical. Briefly, [name] is a 76 y.o. year old patient with prostate cancer. Options were discussed and he elected to proceed with surgical removal of the prostate.  ? ?Hospital Course: Patient tolerated the procedure well.  He was then transferred to the floor after an uneventful PACU stay.  His hospital course was uncomplicated.  On POD#1 he had met discharge criteria: was eating a regular diet, was up and ambulating independently,  pain was well controlled, was tolerating his catheter well, and was ready to for discharge. ? ? ?Laboratory values:  ?Recent Labs  ?  08/02/21 ?1354 08/03/21 ?0407  ?WBC 10.7* 8.3  ?HGB 15.6 13.4  ?HCT 47.0 39.4  ? ?Recent Labs  ?  08/02/21 ?1354 08/03/21 ?0407  ?NA 137 139  ?K 4.0 4.1  ?CL 104 109  ?CO2 25 25  ?GLUCOSE 175* 160*  ?BUN 17 15  ?CREATININE 1.16 1.13  ?CALCIUM 8.7* 8.8*  ? ?No results for input(s): LABPT, INR in the last 72 hours. ?No results for input(s): LABURIN in the last 72 hours. ?Results for orders placed or performed during the hospital encounter of 02/15/19  ?SARS Coronavirus 2 by RT PCR (hospital order, performed in [facility] hospital lab) Nasopharyngeal Nasopharyngeal Swab     Status: Abnormal  ?  Collection Time: 02/15/19 10:47 AM  ? Specimen: Nasopharyngeal Swab  ?Result Value Ref Range Status  ? SARS Coronavirus 2 POSITIVE (A) NEGATIVE Final  ?  Comment: RESULT CALLED TO, READ BACK BY AND VERIFIED WITH: ?A. McKeown RN 12:55 02/15/19 ([email or web address]) ?(NOTE) ?If result is NEGATIVE ?SARS-CoV-2 target nucleic acids are NOT DETECTED. ?The SARS-CoV-2 RNA is generally detectable in upper and lower  ?respiratory specimens during the acute phase of infection. The lowest  ?concentration of SARS-CoV-2 viral copies this assay can detect is 250  ?copies / mL. A negative result does not preclude SARS-CoV-2 infection  ?and should not be used as the sole basis for treatment or other  ?patient management decisions.  A negative result may occur with  ?improper specimen collection / handling, submission of specimen other  ?than nasopharyngeal swab, presence of viral mutation(s) within the  ?areas targeted by this assay, and inadequate number of viral copies  ?(<250 copies / mL). A negative result must be combined with clinical  ?observations, patient history, and epidemiological information. ?If result is POSITIVE ?SARS-CoV-2 target nucleic acids are DETECTED.  ?The SARS-CoV-2 RNA is generally detectable in upper and lower  ?respiratory specimens during the acute phase of infection.  Positive  ?results are indicative of active infection with SARS-CoV-2.  Clinical  ?correlation with patient history and other diagnostic information is  ?necessary to determine patient infection status.  Positive results do  ?not rule out bacterial infection or co-infection with other viruses. ?If result is PRESUMPTIVE POSTIVE ?SARS-CoV-2 nucleic acids MAY BE PRESENT.   ?  A presumptive positive result was obtained on the submitted specimen  ?and confirmed on repeat testing.  While 2019 novel coronavirus  ?(SARS-CoV-2) nucleic acids may be present in the submitted sample  ?additional confirmatory testing may be necessary for epidemiological  ?and / or  clinical management purposes  to differentiate between  ?SARS-CoV-2 and other Sarbecovirus currently known to infect humans.  ?If clinically indicated additional testing with an alternate test  ?methodology [phone])  is advised. The SARS-CoV-2 RNA is generally  ?detectable in upper and lower respiratory specimens during the acute  ?phase of infection. ?The expected result is Negative. ?Fact Sheet for Patients:  StrictlyIdeas.no ?Fact Sheet for Healthcare Providers: ?BankingDealers.co.za ?This test is not yet approved or cleared by the [place] FDA and ?has been authorized for detection and/or diagnosis of SARS-CoV-2 by ?FDA under an Emergency Use Authorization (EUA).  This EUA will remain ?in effect (meaning this test can be used) for the duration of the ?COVID-19 declaration under Section 564(b)(1) of the Act, 21 U.S.C. ?section 360bbb-3(b)(1), unless the authorization is terminated or ?revoked sooner. ?Performed at [facility], [place] [address] [place], [place] ?[address] ?  ?Blood Culture (routine x 2)     Status: None  ? Collection Time: 02/15/19 10:50 AM  ? Specimen: BLOOD  ?Result Value Ref Range Status  ? Specimen Description BLOOD LEFT ANTECUBITAL  Final  ? Special Requests   Final  ?  BOTTLES DRAWN AEROBIC AND ANAEROBIC Blood Culture adequate volume  ? Culture   Final  ?  NO GROWTH 5 DAYS ?Performed at [facility], [place] [address], [place], [place] [address] ?  ? Report Status 02/20/2019 FINAL  Final  ?Blood Culture (routine x 2)     Status: None  ? Collection Time: 02/15/19 11:10 AM  ? Specimen: BLOOD  ?Result Value Ref Range Status  ? Specimen Description BLOOD SITE NOT SPECIFIED  Final  ? Special Requests   Final  ?  BOTTLES DRAWN AEROBIC AND ANAEROBIC Blood Culture adequate volume  ? Culture   Final  ?  NO GROWTH 5 DAYS ?Performed at [facility], [place] [address], [place], [place] [address] ?  ? Report Status 02/20/2019 FINAL  Final   ? ? ?Disposition: Home ? ?Discharge instruction: The patient was instructed to be ambulatory but told to refrain from heavy lifting, strenuous activity, or driving.  ? ?Discharge medications:  ?Allergies as of 08/03/2021   ?No Known Allergies ?  ? ?  ?Medication List  ?  ? ?TAKE these medications   ? ?lisinopril-hydrochlorothiazide 20-12.5 MG tablet ?Commonly known as: ZESTORETIC ?Take 1 tablet by mouth once daily ?  ?oxyCODONE-acetaminophen 5-325 MG tablet ?Commonly known as: Percocet ?Take 1 tablet by mouth every 4 (four) hours as needed for up to 5 days for severe pain. ?  ?senna-docusate 8.6-50 MG tablet ?Commonly known as: Senokot-S ?Take 1 tablet by mouth daily. ?  ? ?  ? ? ?Followup: Patient will be scheduled for [facility]rology [facility]ident [facility] follow up in 10-14 days for Foley catheter removal. ? ? ?

## 2021-08-03 NOTE — Progress Notes (Addendum)
AVS given to patient and explained at the bedside. Medications and follow up appointments have been explained with pt verbalizing understanding. Foley care teaching provided at bedside with teach back. ? ?

## 2021-08-03 NOTE — Discharge Instructions (Signed)

## 2021-08-07 LAB — SURGICAL PATHOLOGY

## 2021-08-12 ENCOUNTER — Encounter: Payer: Self-pay | Admitting: Internal Medicine

## 2021-08-19 ENCOUNTER — Other Ambulatory Visit: Payer: Self-pay | Admitting: Internal Medicine

## 2021-08-19 DIAGNOSIS — Z23 Encounter for immunization: Secondary | ICD-10-CM

## 2021-08-19 DIAGNOSIS — R7309 Other abnormal glucose: Secondary | ICD-10-CM

## 2021-08-19 DIAGNOSIS — Z79899 Other long term (current) drug therapy: Secondary | ICD-10-CM

## 2021-08-19 DIAGNOSIS — E782 Mixed hyperlipidemia: Secondary | ICD-10-CM

## 2021-08-20 LAB — COMPREHENSIVE METABOLIC PANEL
ALT: 21 IU/L (ref 0–44)
AST: 18 IU/L (ref 0–40)
Albumin/Globulin Ratio: 2.4 — ABNORMAL HIGH (ref 1.2–2.2)
Albumin: 4.6 g/dL (ref 3.7–4.7)
Alkaline Phosphatase: 86 IU/L (ref 44–121)
BUN/Creatinine Ratio: 19 (ref 10–24)
BUN: 19 mg/dL (ref 8–27)
Bilirubin Total: 0.6 mg/dL (ref 0.0–1.2)
CO2: 25 mmol/L (ref 20–29)
Calcium: 10.1 mg/dL (ref 8.6–10.2)
Chloride: 104 mmol/L (ref 96–106)
Creatinine, Ser: 1.02 mg/dL (ref 0.76–1.27)
Globulin, Total: 1.9 g/dL (ref 1.5–4.5)
Glucose: 110 mg/dL — ABNORMAL HIGH (ref 70–99)
Potassium: 4.2 mmol/L (ref 3.5–5.2)
Sodium: 143 mmol/L (ref 134–144)
Total Protein: 6.5 g/dL (ref 6.0–8.5)
eGFR: 78 mL/min/{1.73_m2} (ref >59)

## 2021-08-20 LAB — CBC WITH DIFFERENTIAL/PLATELET
Basophils Absolute: 0 10*3/uL (ref 0.0–0.2)
Basos: 0 %
EOS (ABSOLUTE): 0.2 10*3/uL (ref 0.0–0.4)
Eos: 4 %
Hematocrit: 43.8 % (ref 37.5–51.0)
Hemoglobin: 14.4 g/dL (ref 13.0–17.7)
Immature Grans (Abs): 0 10*3/uL (ref 0.0–0.1)
Immature Granulocytes: 1 %
Lymphocytes Absolute: 1.1 10*3/uL (ref 0.7–3.1)
Lymphs: 23 %
MCH: 29.9 pg (ref 26.6–33.0)
MCHC: 32.9 g/dL (ref 31.5–35.7)
MCV: 91 fL (ref 79–97)
Monocytes Absolute: 0.4 10*3/uL (ref 0.1–0.9)
Monocytes: 9 %
Neutrophils Absolute: 3 10*3/uL (ref 1.4–7.0)
Neutrophils: 63 %
Platelets: 335 10*3/uL (ref 150–450)
RBC: 4.81 x10E6/uL (ref 4.14–5.80)
RDW: 13.3 % (ref 11.6–15.4)
WBC: 4.7 10*3/uL (ref 3.4–10.8)

## 2021-08-20 LAB — LIPID PANEL W/O CHOL/HDL RATIO
Cholesterol, Total: 192 mg/dL (ref 100–199)
HDL: 49 mg/dL (ref >39)
LDL Chol Calc (NIH): 115 mg/dL — ABNORMAL HIGH (ref 0–99)
Triglycerides: 156 mg/dL — ABNORMAL HIGH (ref 0–149)
VLDL Cholesterol Cal: 28 mg/dL (ref 5–40)

## 2021-08-20 LAB — HEMOGLOBIN A1C
Est. average glucose Bld gHb Est-mCnc: 120 mg/dL
Hgb A1c MFr Bld: 5.8 % — ABNORMAL HIGH (ref 4.8–5.6)

## 2021-10-28 ENCOUNTER — Ambulatory Visit: Payer: Self-pay | Admitting: Internal Medicine

## 2021-10-31 ENCOUNTER — Other Ambulatory Visit: Payer: Self-pay | Admitting: Internal Medicine

## 2021-11-08 ENCOUNTER — Other Ambulatory Visit: Payer: Self-pay

## 2021-11-08 DIAGNOSIS — C61 Malignant neoplasm of prostate: Secondary | ICD-10-CM

## 2021-11-09 LAB — PSA: Prostate Specific Ag, Serum: <0.1 ng/mL (ref 0.0–4.0)

## 2021-12-17 ENCOUNTER — Ambulatory Visit: Payer: Self-pay | Admitting: Internal Medicine

## 2022-02-28 ENCOUNTER — Ambulatory Visit: Payer: Self-pay | Admitting: Internal Medicine

## 2022-03-19 NOTE — Telephone Encounter (Signed)
Closing note 

## 2022-05-05 ENCOUNTER — Encounter: Payer: Self-pay | Admitting: Internal Medicine

## 2022-05-06 ENCOUNTER — Ambulatory Visit: Payer: Self-pay | Admitting: Internal Medicine

## 2022-05-06 ENCOUNTER — Encounter: Payer: Self-pay | Admitting: Internal Medicine

## 2022-05-06 VITALS — BP 120/90 | HR 66 | Resp 16 | Ht 66.0 in | Wt 183.0 lb

## 2022-05-06 DIAGNOSIS — E782 Mixed hyperlipidemia: Secondary | ICD-10-CM

## 2022-05-06 DIAGNOSIS — Z0000 Encounter for general adult medical examination without abnormal findings: Secondary | ICD-10-CM

## 2022-05-06 DIAGNOSIS — Z23 Encounter for immunization: Secondary | ICD-10-CM

## 2022-05-06 DIAGNOSIS — C61 Malignant neoplasm of prostate: Secondary | ICD-10-CM

## 2022-05-06 DIAGNOSIS — I10 Essential (primary) hypertension: Secondary | ICD-10-CM

## 2022-05-06 DIAGNOSIS — Z9189 Other specified personal risk factors, not elsewhere classified: Secondary | ICD-10-CM

## 2022-05-06 NOTE — Progress Notes (Signed)
Subjective:    Patient ID: [name], male   DOB: [birth year], 77 y.o.   MRN: [id number]   HPI  Adult Step-Daughter interprets  Here for Male CPE:  1.  STE:  No family history of testicular cancer.    2.  PSA:   History of elevated PSA and radical prostatectomy 07/2021.  He  had a PSA with urology subsequent to surgery, and registered less than 0.4.  He had follow up with urology recently, but they did not check his PSA then and are asking [place] to do so.  Dr. Abner Greenspan.    3.  Guaiac Cards/FIT:  Last performed 04/2021 and negative.  4.  Colonoscopy:   Never.    5.  Cholesterol/Glucose:  Last cholesterol high normal with mildly elevate trigs.  Prediabetes with A1C at 5.8% 08/2021.  States has been walking more Lipid Panel     Component Value Date/Time   CHOL 192 08/19/2021 0923   TRIG 156 (H) 08/19/2021 0923   HDL 49 08/19/2021 0923   CHOLHDL 3.5 02/17/2019 0330   VLDL 6 02/17/2019 0330   LDLCALC 115 (H) 08/19/2021 0923   LABVLDL 28 08/19/2021 0923     6.  Immunizations:  Has not had influenza or COVID vaccines this fall. Immunization History  Administered Date(s) Administered   Influenza,inj,Quad PF,6+ Mos 02/03/2019   Influenza-Unspecified 02/03/2019, 12/21/2019, 02/04/2021   Moderna SARS-COV2 Booster Vaccination 04/29/2021   Moderna Sars-Covid-2 Vaccination 06/20/2019, 07/18/2019   PFIZER Comirnaty(Gray Top)Covid-19 Tri-Sucrose Vaccine 11/06/2020   Pneumococcal Conjugate-13 03/23/2019   Pneumococcal Polysaccharide-23 05/05/2017   Tdap 05/05/2017   Zoster Recombinat (Shingrix) 04/29/2021, 08/19/2021     Current Meds  Medication Sig   lisinopril-hydrochlorothiazide (ZESTORETIC) 20-12.5 MG tablet Take 1 tablet by mouth once daily   No Known Allergies  Past Medical History:  Diagnosis Date   Cancer ([place])    Dyslipidemia 2018   Elevated PSA, between 10 and less than 20 ng/ml    Hyperlipidemia 2021   Hypertension 2008   Pneumonia    Prostate cancer ([place])  07/2021   path from radical prostatectomy.  Lymph nodes negative,   Subconjunctival hemorrhage of left eye 08/03/2019   Past Surgical History:  Procedure Laterality Date   HERNIA REPAIR     INGUINAL HERNIA REPAIR Bilateral 11/17/2017   Procedure: LAPAROSCOPIC BILATERAL INGUINAL HERNIA REPAIR;  Surgeon: Ralene Ok, MD;  Location: [facility];  Service: General;  Laterality: Bilateral;   INSERTION OF MESH Bilateral 11/17/2017   Procedure: INSERTION OF MESH;  Surgeon: Ralene Ok, MD;  Location: [facility];  Service: General;  Laterality: Bilateral;   LYMPHADENECTOMY Bilateral 08/02/2021   Procedure: Noel Journey, PELVIC;  Surgeon: Janith Lima, MD;  Location: [facility];  Service: Urology;  Laterality: Bilateral;   PROSTATE BIOPSY  2022   ROBOT ASSISTED LAPAROSCOPIC RADICAL PROSTATECTOMY N/A 08/02/2021   Procedure: XI ROBOTIC ASSISTED LAPAROSCOPIC RADICAL PROSTATECTOMY;  Surgeon: Janith Lima, MD;  Location: [facility];  Service: Urology;  Laterality: N/A;   Family History  Problem Relation Age of Onset   Hypertension Sister    Hypertension Sister    Hypertension Sister    Hypertension Brother    Stroke Brother    Hypertension Brother    Alcohol abuse Brother    Cirrhosis Brother        Cause of death   Hypertension Brother    Hypertension Brother    Scoliosis Daughter    Social History   Socioeconomic History   Marital status:  Divorced    Spouse name: Not on file   Number of children: 3   Years of education: 78 + 1 year college   Highest education level: Not on file  Occupational History   Occupation: Maintenance/custodial work in past-retired  Tobacco Use   Smoking status: Former    Packs/day: 0.10    Years: 2.00    Total pack years: 0.20    Types: Cigarettes    Passive exposure: Past   Smokeless tobacco: Never   Tobacco comments:    States was > 20 years ago  Vaping Use   Vaping Use: Never used  Substance and Sexual Activity   Alcohol use: Not Currently     Alcohol/week: 1.0 standard drink of alcohol    Types: 1 Cans of beer per week    Comment: occasionally   Drug use: No   Sexual activity: Not Currently  Other Topics Concern   Not on file  Social History Narrative   From [place]   Lives with daughter, [name], her husband and her one son.   Social Determinants of Health   Financial Resource Strain: Low Risk  (05/06/2022)   Overall Financial Resource Strain (CARDIA)    Difficulty of Paying Living Expenses: Not hard at all  Food Insecurity: No Food Insecurity (05/06/2022)   Hunger Vital Sign    Worried About Running Out of Food in the Last Year: Never true    Ran Out of Food in the Last Year: Never true  Transportation Needs: No Transportation Needs (05/06/2022)   PRAPARE - Hydrologist (Medical): No    Lack of Transportation (Non-Medical): No  Physical Activity: Not on file  Stress: No Stress Concern Present (04/28/2017)   [place]    Feeling of Stress : Not at all  Social Connections: Not on file  Intimate Partner Violence: Not At Risk (05/06/2022)   Humiliation, Afraid, Rape, and Kick questionnaire    Fear of Current or Ex-Partner: No    Emotionally Abused: No    Physically Abused: No    Sexually Abused: No      Review of Systems  HENT:  Negative for dental problem.   Respiratory:  Negative for shortness of breath.   Cardiovascular:  Negative for chest pain, palpitations and leg swelling.  Gastrointestinal:  Negative for abdominal pain and blood in stool (No melena).  Genitourinary:        Mild incontinence at times.  Psychiatric/Behavioral:  Negative for dysphoric mood. The patient is not nervous/anxious.       Objective:   BP (!) 120/90 (BP Location: Left Arm, Patient Position: Sitting, Cuff Size: Normal)   Pulse 66   Resp 16   Ht '5\' 6"'$  (1.676 m)   Wt 183 lb (83 kg)   BMI 29.54 kg/m   Physical Exam HENT:     Head:  Normocephalic and atraumatic.     Right Ear: Tympanic membrane, ear canal and external ear normal.     Left Ear: Tympanic membrane, ear canal and external ear normal.     Nose: Nose normal.     Mouth/Throat:     Mouth: Mucous membranes are dry.     Pharynx: Oropharynx is clear.  Eyes:     Extraocular Movements: Extraocular movements intact.     Conjunctiva/sclera: Conjunctivae normal.     Pupils: Pupils are equal, round, and reactive to light.     Comments: Discs sharp  Neck:     Thyroid: No thyroid mass or thyromegaly.  Cardiovascular:     Rate and Rhythm: Normal rate and regular rhythm.     Heart sounds: S1 normal and S2 normal. No murmur heard.    No friction rub. No S3 or S4 sounds.     Comments: No carotid bruits.  Carotid, radial, femoral, DP and PT pulses normal and equal.   Pulmonary:     Effort: Pulmonary effort is normal.     Breath sounds: Normal breath sounds and air entry.  Abdominal:     General: Bowel sounds are normal.     Palpations: Abdomen is soft. There is no hepatomegaly, splenomegaly or mass.     Tenderness: There is no abdominal tenderness.     Hernia: No hernia is present.     Comments: midline surgical scar well healed  Genitourinary:    Penis: Uncircumcised.      Testes:        Right: Mass, tenderness or swelling not present. Right testis is descended.        Left: Mass, tenderness or swelling not present. Left testis is descended.  Musculoskeletal:        General: Normal range of motion.     Cervical back: Normal range of motion and neck supple.     Right lower leg: No edema.     Left lower leg: No edema.  Lymphadenopathy:     Head:     Right side of head: No submental or submandibular adenopathy.     Left side of head: No submental or submandibular adenopathy.     Cervical: No cervical adenopathy.     Upper Body:     Right upper body: No supraclavicular or axillary adenopathy.     Left upper body: No supraclavicular or axillary adenopathy.      Lower Body: No right inguinal adenopathy. No left inguinal adenopathy.  Skin:    General: Skin is warm.     Capillary Refill: Capillary refill takes less than 2 seconds.     Findings: No rash.  Neurological:     General: No focal deficit present.     Mental Status: He is alert and oriented to person, place, and time.     Cranial Nerves: Cranial nerves 2-12 are intact.     Sensory: Sensation is intact.     Motor: Motor function is intact.     Coordination: Coordination is intact.     Gait: Gait is intact.     Deep Tendon Reflexes: Reflexes are normal and symmetric.  Psychiatric:        Mood and Affect: Mood normal.        Speech: Speech normal.        Behavior: Behavior normal. Behavior is cooperative.      Assessment & Plan    CPE Fit to return in [redacted] weeks along with fasting labs:  FLP, CBC, CMP, PSA, total and free, A1C Spikevax COVID vaccination. Encouraged influenza vaccine at pharmacy or PHD as we are currently out. Dental referral.  2.  Hypertension:  diastolic up today.  Recheck with labs in 2 weeks.  3.  Prediabetes and elevated trigs/history of elevated total:  labs upcoming.  4.  Hx of prostate Ca:  labs in 2 weeks.

## 2022-05-17 ENCOUNTER — Other Ambulatory Visit: Payer: Self-pay | Admitting: Internal Medicine

## 2022-05-19 ENCOUNTER — Other Ambulatory Visit: Payer: Self-pay

## 2022-05-19 VITALS — BP 110/80 | HR 84

## 2022-05-19 DIAGNOSIS — E782 Mixed hyperlipidemia: Secondary | ICD-10-CM

## 2022-05-19 DIAGNOSIS — Z0130 Encounter for examination of blood pressure without abnormal findings: Secondary | ICD-10-CM

## 2022-05-19 DIAGNOSIS — Z0000 Encounter for general adult medical examination without abnormal findings: Secondary | ICD-10-CM

## 2022-05-19 DIAGNOSIS — Z1211 Encounter for screening for malignant neoplasm of colon: Secondary | ICD-10-CM

## 2022-05-19 DIAGNOSIS — C61 Malignant neoplasm of prostate: Secondary | ICD-10-CM

## 2022-05-19 LAB — POC FIT TEST STOOL: Fecal Occult Blood: NEGATIVE

## 2022-05-20 LAB — CBC WITH DIFFERENTIAL/PLATELET
Basophils Absolute: 0 10*3/uL (ref 0.0–0.2)
Basos: 0 %
EOS (ABSOLUTE): 0.2 10*3/uL (ref 0.0–0.4)
Eos: 4 %
Hematocrit: 47.2 % (ref 37.5–51.0)
Hemoglobin: 15.9 g/dL (ref 13.0–17.7)
Immature Grans (Abs): 0 10*3/uL (ref 0.0–0.1)
Immature Granulocytes: 0 %
Lymphocytes Absolute: 1.4 10*3/uL (ref 0.7–3.1)
Lymphs: 32 %
MCH: 30.2 pg (ref 26.6–33.0)
MCHC: 33.7 g/dL (ref 31.5–35.7)
MCV: 90 fL (ref 79–97)
Monocytes Absolute: 0.4 10*3/uL (ref 0.1–0.9)
Monocytes: 8 %
Neutrophils Absolute: 2.6 10*3/uL (ref 1.4–7.0)
Neutrophils: 56 %
Platelets: 222 10*3/uL (ref 150–450)
RBC: 5.26 x10E6/uL (ref 4.14–5.80)
RDW: 13.9 % (ref 11.6–15.4)
WBC: 4.5 10*3/uL (ref 3.4–10.8)

## 2022-05-20 LAB — COMPREHENSIVE METABOLIC PANEL
ALT: 21 IU/L (ref 0–44)
AST: 16 IU/L (ref 0–40)
Albumin/Globulin Ratio: 2.3 — ABNORMAL HIGH (ref 1.2–2.2)
Albumin: 4.8 g/dL (ref 3.8–4.8)
Alkaline Phosphatase: 81 IU/L (ref 44–121)
BUN/Creatinine Ratio: 12 (ref 10–24)
BUN: 14 mg/dL (ref 8–27)
Bilirubin Total: 0.8 mg/dL (ref 0.0–1.2)
CO2: 24 mmol/L (ref 20–29)
Calcium: 10.1 mg/dL (ref 8.6–10.2)
Chloride: 103 mmol/L (ref 96–106)
Creatinine, Ser: 1.19 mg/dL (ref 0.76–1.27)
Globulin, Total: 2.1 g/dL (ref 1.5–4.5)
Glucose: 102 mg/dL — ABNORMAL HIGH (ref 70–99)
Potassium: 4.2 mmol/L (ref 3.5–5.2)
Sodium: 143 mmol/L (ref 134–144)
Total Protein: 6.9 g/dL (ref 6.0–8.5)
eGFR: 64 mL/min/{1.73_m2} (ref >59)

## 2022-05-20 LAB — PSA, TOTAL AND FREE
PSA, Free: <0.02 ng/mL
Prostate Specific Ag, Serum: <0.1 ng/mL (ref 0.0–4.0)

## 2022-05-20 LAB — HEMOGLOBIN A1C
Est. average glucose Bld gHb Est-mCnc: 123 mg/dL
Hgb A1c MFr Bld: 5.9 % — ABNORMAL HIGH (ref 4.8–5.6)

## 2022-05-20 LAB — LIPID PANEL W/O CHOL/HDL RATIO
Cholesterol, Total: 190 mg/dL (ref 100–199)
HDL: 44 mg/dL (ref >39)
LDL Chol Calc (NIH): 118 mg/dL — ABNORMAL HIGH (ref 0–99)
Triglycerides: 159 mg/dL — ABNORMAL HIGH (ref 0–149)
VLDL Cholesterol Cal: 28 mg/dL (ref 5–40)

## 2022-10-14 ENCOUNTER — Telehealth: Payer: Self-pay

## 2022-10-14 NOTE — Telephone Encounter (Signed)
Patient would like push his appointment back a couple weeks. Wants to keep appointment if no appointment becomes available.

## 2022-11-04 ENCOUNTER — Ambulatory Visit: Payer: Self-pay | Admitting: Internal Medicine

## 2022-11-04 ENCOUNTER — Encounter: Payer: Self-pay | Admitting: Internal Medicine

## 2022-11-04 VITALS — BP 128/78 | HR 82 | Resp 16 | Ht 66.0 in | Wt 183.0 lb

## 2022-11-04 DIAGNOSIS — E782 Mixed hyperlipidemia: Secondary | ICD-10-CM

## 2022-11-04 DIAGNOSIS — I10 Essential (primary) hypertension: Secondary | ICD-10-CM

## 2022-11-04 DIAGNOSIS — R413 Other amnesia: Secondary | ICD-10-CM

## 2022-11-04 DIAGNOSIS — R7303 Prediabetes: Secondary | ICD-10-CM | POA: Insufficient documentation

## 2022-11-04 NOTE — Progress Notes (Signed)
 Subjective:    Patient ID: [name], male   DOB: 05/01/1945, 77 y.o.   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 With [place] [employer].  Retired     11/04/2022    4:34 PM  MMSE - Mini Mental State Exam  Orientation to time 5  Orientation to Place 5  Registration 3  Attention/ Calculation 4  Recall 0  Language- name 2 objects 2  Language- repeat 1  Language- follow 3 step command 3  Language- read & follow direction 1  Write a sentence 1  Copy design 1  Total score 26   2.  Hypertension:  no problem with Lisinopril /hydrochlorothiazide .    3.  Prediabetes:  A1C up to 5.9% in January.   Walks for an hour daily.    Butter bread and cheese for breakfast Coffee with milk -whole  Lunch:  Beans and rice with pescado.  Drinks water .  Dinner:  may have a cracker.    Discussed eating more veggies and eating nuts or olives instead of a cracker at night.        Current Meds  Medication Sig   lisinopril -hydrochlorothiazide  (ZESTORETIC ) 20-12.5 MG tablet Take 1 tablet by mouth once daily   No Known Allergies   Review of Systems    Objective:   BP 128/78 (BP Location: Left Arm, Patient Position: Sitting, Cuff Size: Normal)   Pulse 82   Resp 16   Ht 5' 6 (1.676 m)   Wt 183 lb (83 kg)   BMI 29.54 kg/m   Physical Exam HENT:     Head: Normocephalic and atraumatic.     Right Ear: Tympanic membrane, ear canal and external ear normal.     Left Ear: Tympanic membrane, ear canal and external ear normal.     Nose: Nose normal.     Mouth/Throat:     Mouth: Mucous membranes are moist.     Pharynx: Oropharynx is clear.  Eyes:     Extraocular Movements: Extraocular movements intact.     Conjunctiva/sclera: Conjunctivae normal.     Pupils: Pupils are equal, round, and reactive to light.     Comments: Discs sharp  Neck:     Thyroid: No thyroid mass or thyromegaly.   Cardiovascular:     Rate and Rhythm: Normal rate and regular rhythm.     Heart sounds: S1 normal and S2 normal. No murmur heard.    No friction rub. No S3 or S4 sounds.     Comments: No carotid bruits.  Carotid, radial, femoral, DP and PT pulses normal and equal.   Pulmonary:     Effort: Pulmonary effort is normal.     Breath sounds: Normal breath sounds and air entry.  Abdominal:     General: Bowel sounds are normal.     Palpations: Abdomen is soft. There is no hepatomegaly, splenomegaly or mass.     Tenderness: There is no abdominal tenderness.     Hernia: No hernia is present.  Musculoskeletal:     Cervical back: Normal range of motion and neck supple.     Right lower leg: No edema.     Left lower leg: No edema.  Skin:    General: Skin is warm.     Capillary Refill: Capillary refill takes less than 2 seconds.     Findings: No rash.  Neurological:  General: No focal deficit present.     Mental Status: He is alert and oriented to person, place, and time.     Cranial Nerves: Cranial nerves 2-12 are intact.     Sensory: Sensation is intact.     Motor: Motor function is intact.     Coordination: Coordination is intact.     Gait: Gait is intact.     Deep Tendon Reflexes: Reflexes are normal and symmetric.  Psychiatric:        Mood and Affect: Mood normal.        Behavior: Behavior normal. Behavior is cooperative.      Assessment & Plan   Paperwork for [employer]:  MMSE normal range.  Exam normal.  Letter written  2.  Hypertension:  controlled with current meds  3.  Prediabetes and hyperlipidemia:  continue to work on lifestyle changes.

## 2022-11-05 NOTE — Telephone Encounter (Signed)
 Patient has been seen.

## 2023-02-02 ENCOUNTER — Other Ambulatory Visit: Payer: Self-pay

## 2023-02-02 DIAGNOSIS — C61 Malignant neoplasm of prostate: Secondary | ICD-10-CM

## 2023-02-03 ENCOUNTER — Other Ambulatory Visit: Payer: Self-pay

## 2023-02-03 LAB — PSA: Prostate Specific Ag, Serum: 0.2 ng/mL (ref 0.0–4.0)

## 2023-02-08 IMAGING — CT CT ABD-PELV W/ CM
2 of 5 series · 15 of 46 positions shown, 17 images · IV contrast (OMNIPAQUE)
Comparison: None.

CLINICAL DATA: Prostate cancer staging.

EXAM:
CT ABDOMEN AND PELVIS WITH CONTRAST
TECHNIQUE: Multidetector CT imaging of the abdomen and pelvis was performed
using the standard protocol following bolus administration of
intravenous contrast.

[Series 2: axial st · axial · 0.80mm/px · z∈[-479,-39]mm · 12 of 105 slices shown, 14 images]
[im 9/105  soft-tissue]
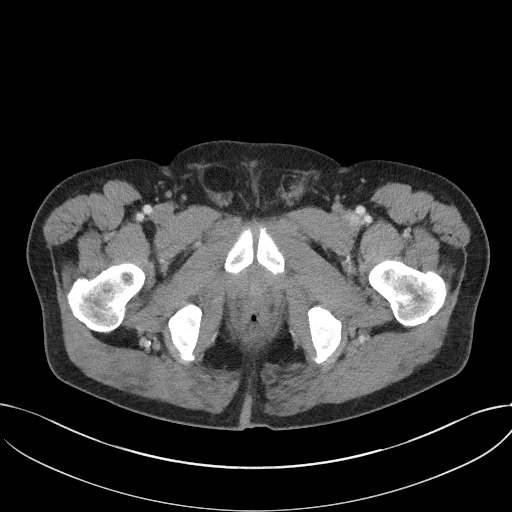
[im 9/105  bone]
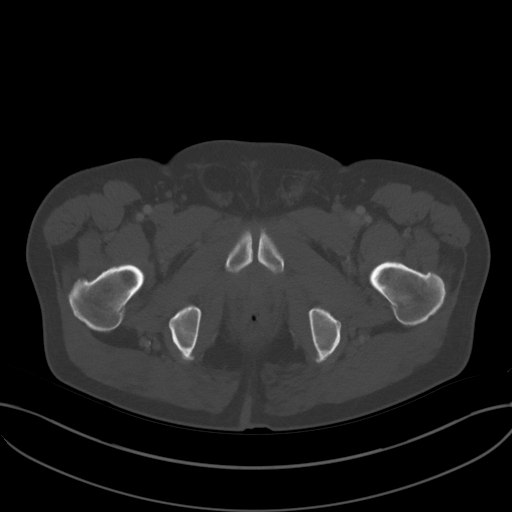
[im 17/105  soft-tissue]
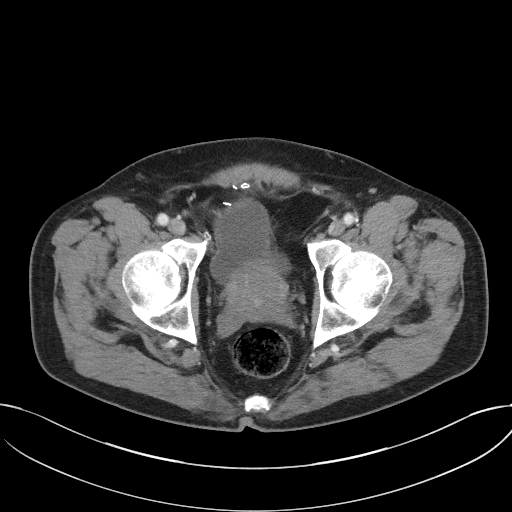
[im 25/105  soft-tissue]
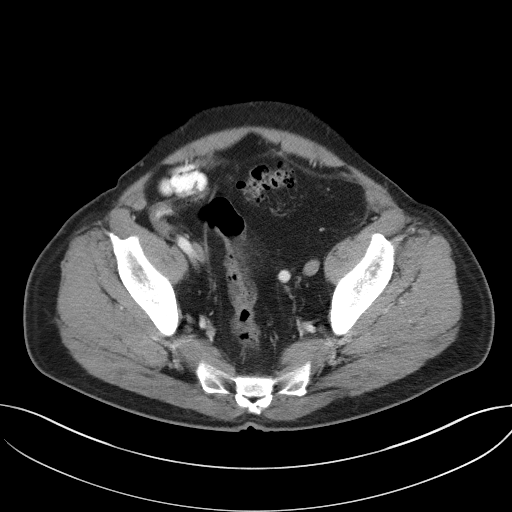
[im 33/105  soft-tissue]
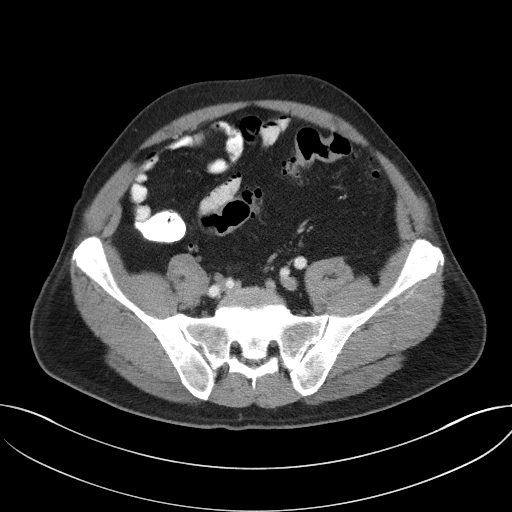
[im 41/105  soft-tissue]
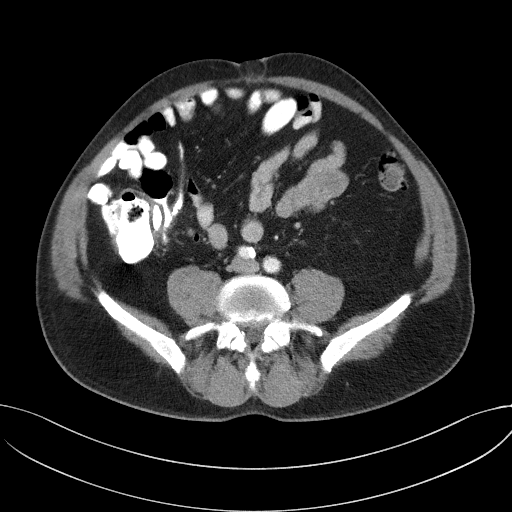
[im 49/105  soft-tissue]
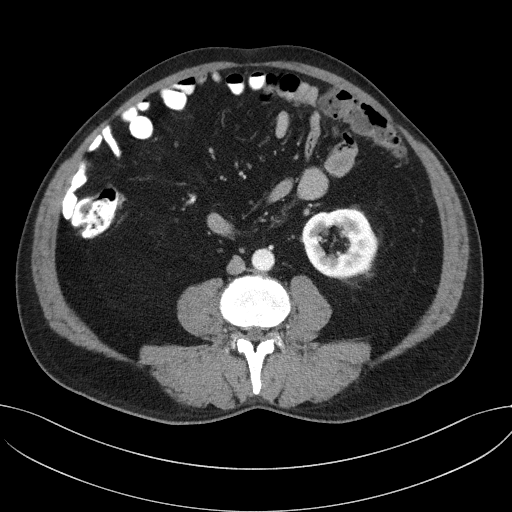
[im 57/105  soft-tissue]
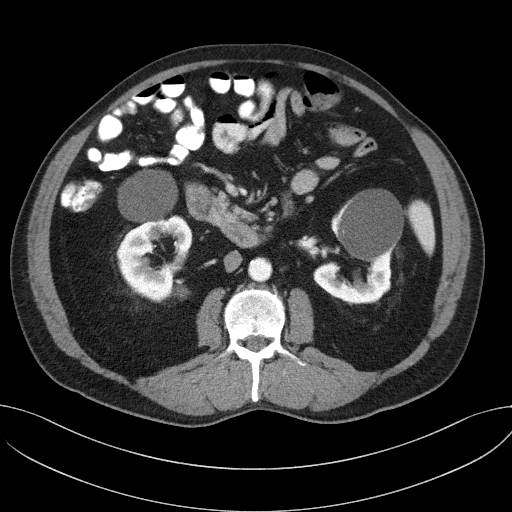
[im 65/105  soft-tissue]
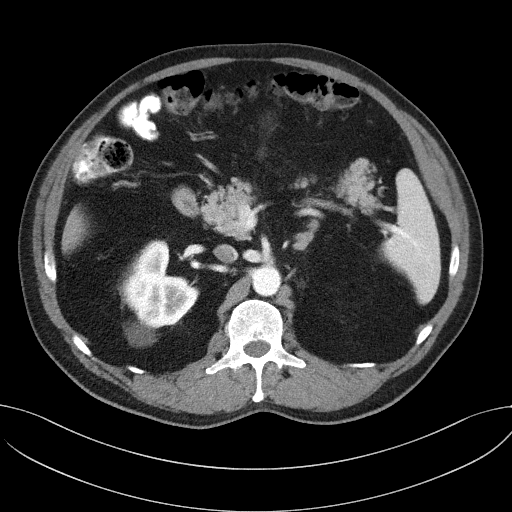
[im 73/105  soft-tissue]
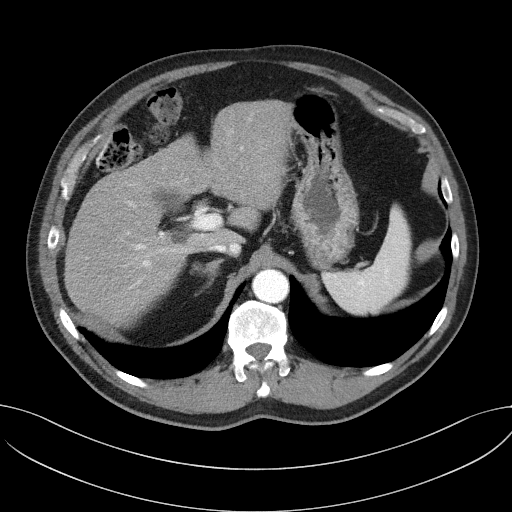
[im 73/105  bone]
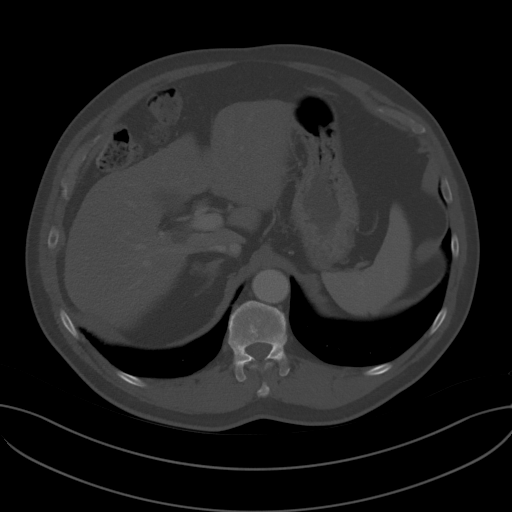
[im 81/105  soft-tissue]
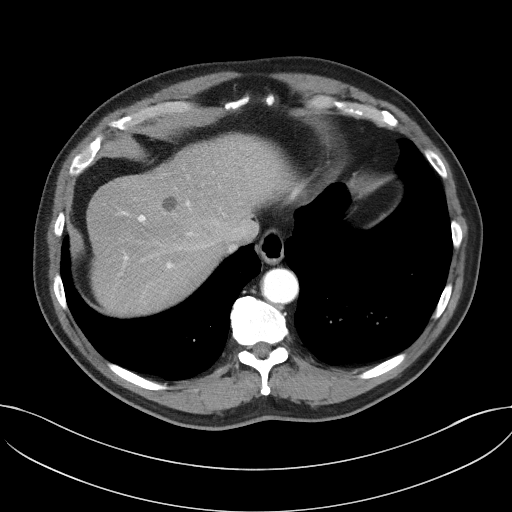
[im 89/105  soft-tissue]
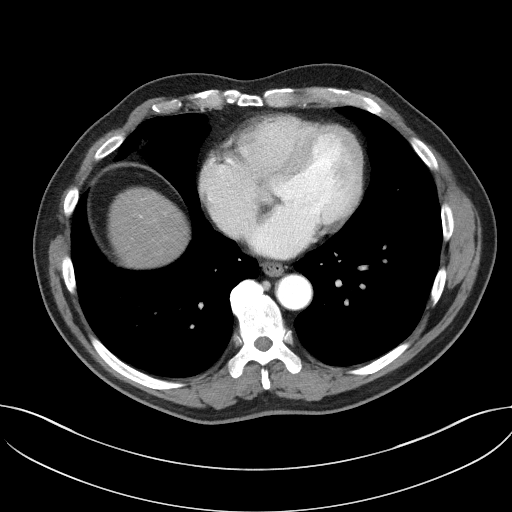
[im 97/105  soft-tissue]
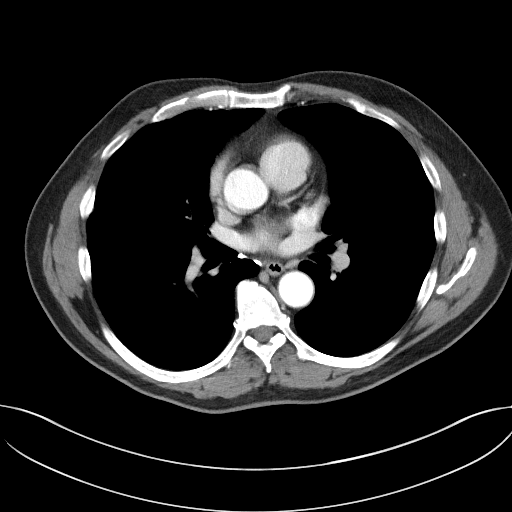

[Series 4: coronal st · coronal · 0.80mm/px · 3 of 107 slices shown]
[im 36/107  soft-tissue]
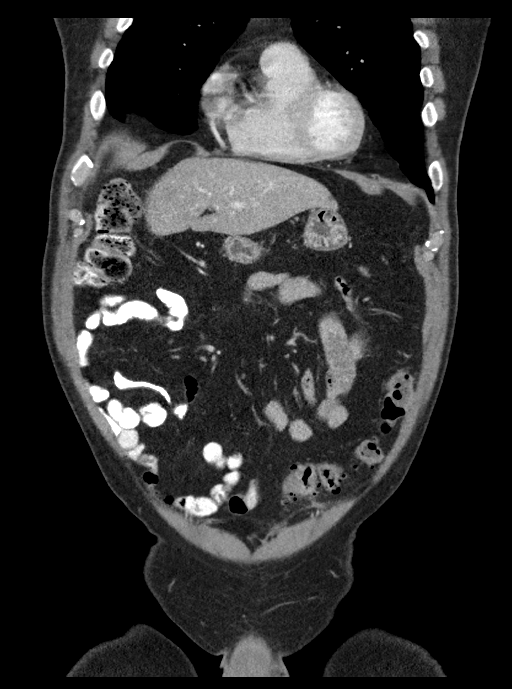
[im 48/107  soft-tissue]
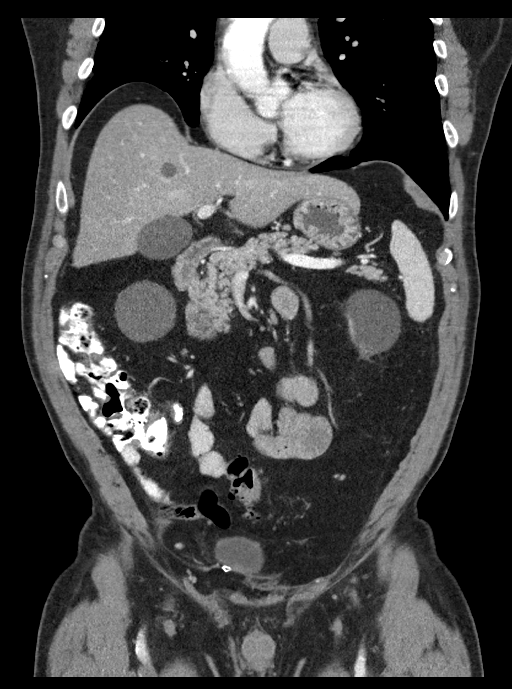
[im 59/107  soft-tissue]
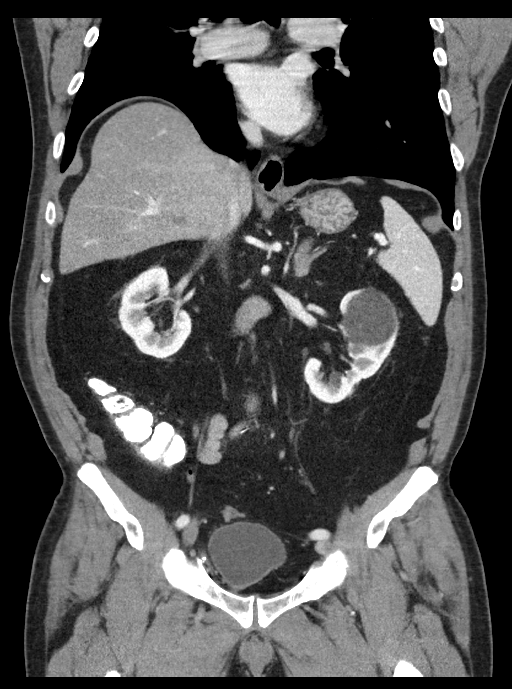

[15 of 46 positions shown; findings below may reference images not displayed]

RADIATION DOSE REDUCTION: This exam was performed according to the
departmental dose-optimization program which includes automated
exposure control, adjustment of the mA and/or kV according to
patient size and/or use of iterative reconstruction technique.

CONTRAST:  100mL OMNIPAQUE IOHEXOL 300 MG/ML  SOLN
FINDINGS: Lower chest: Mild atelectasis or scarring is noted at the lung
bases.

Hepatobiliary: Fatty infiltration of the liver is noted. There is
scattered hypodensities in the liver, largest measuring 1.2 cm is
cystic in attenuation. The gallbladder is with out stones. No
biliary ductal dilatation.

Pancreas: Unremarkable. No pancreatic ductal dilatation or
surrounding inflammatory changes.

Spleen: Normal in size without focal abnormality.

Adrenals/Urinary Tract: There is thickening of the bilateral adrenal
glands without evidence of discrete nodule. No renal calculus or
hydronephrosis bilaterally. There are bilateral renal cysts. Mild
renal cortical scarring is noted on the right. The bladder is
unremarkable.

Stomach/Bowel: Stomach is within normal limits. Appendix appears
normal. No evidence of bowel wall thickening, distention, or
inflammatory changes. Scattered diverticula are present along the
colon without evidence of diverticulitis.

Vascular/Lymphatic: Aortic atherosclerosis. No enlarged abdominal or
pelvic lymph nodes.

Reproductive: The prostate gland is enlarged measuring 6.1 cm in
diameter and contains coarse calcifications. No local
lymphadenopathy is identified.

Other: Fat containing inguinal hernias are noted bilaterally.
Surgical clips are present in the low anterior abdominal wall. There
is a fat containing umbilical hernia.

Musculoskeletal: Degenerative changes in the thoracolumbar spine.
There is bilateral spondylolysis at L5 with mild anterolisthesis at
L5-S1. No suspicious sclerotic or destructive lesion is seen within
the bones.
IMPRESSION: 1. Enlarged prostate gland. No evidence of local or distant
metastatic disease.
2. Hepatic steatosis with scattered hypodensities in the liver,
statistically most likely representing cysts or hemangiomas.
3. Bilateral renal cysts.
4. Diverticulosis without diverticulitis.
5. Aortic atherosclerosis.

## 2023-02-20 ENCOUNTER — Other Ambulatory Visit (INDEPENDENT_AMBULATORY_CARE_PROVIDER_SITE_OTHER): Payer: Self-pay | Admitting: Internal Medicine

## 2023-02-20 DIAGNOSIS — R509 Fever, unspecified: Secondary | ICD-10-CM

## 2023-02-20 LAB — POCT INFLUENZA A/B
Influenza A, POC: NEGATIVE
Influenza B, POC: NEGATIVE

## 2023-02-20 LAB — POC COVID19 BINAXNOW: SARS Coronavirus 2 Ag: NEGATIVE

## 2023-02-20 NOTE — Progress Notes (Signed)
Stop Nyquil Have him try the following physical maneuvers:    Breath holding for 5-10 secs Sipping or Gargling with very cold water Biting into a lemon wedge Swallowing a teaspoon of dry, granulated sugar Pressing gently, but firmly on the eyeballs While sitting, pulling the knees up to the chest (or leaning forward to compress the chest) hold the position for 30 secs to 1 min if possible Drinking water through a rigid tube (kind of like what happens when drinking a very thick shake through a straw and cannot pull anything up --he could possibly get a frosty at [facility] and try sucking up through a straw as I have never been able to pull the frosty up through the straw until it has melted .  If no improvement, they should pick up Omeprazole or Prilosec 20 mg and take by mouth daily for 2 weeks.  Please document information his family gave you regarding illness and course.

## 2023-02-20 NOTE — Progress Notes (Signed)
Patient reports having fever, cough and sore throat for the past 3 days. First day had fever of 102 F and second day had fever of 101 F. Patient has been taking cough syrup at night, dayquill pills during the day every 4 hours and herbal tea. Today 02/20/23 patient no longer has a fever or cough and sore throat is mostly gone. However since 4am today, patient has had constant hiccups that have made it difficult to breath. He would like recommendations to help with hiccups.

## 2023-02-23 ENCOUNTER — Encounter: Payer: Self-pay | Admitting: Internal Medicine

## 2023-02-23 ENCOUNTER — Telehealth: Payer: Self-pay

## 2023-02-23 ENCOUNTER — Ambulatory Visit: Payer: Self-pay | Admitting: Internal Medicine

## 2023-02-23 VITALS — BP 134/80 | HR 106 | Temp 98.7°F | Resp 16 | Ht 66.0 in | Wt 174.0 lb

## 2023-02-23 DIAGNOSIS — R066 Hiccough: Secondary | ICD-10-CM

## 2023-02-23 MED ORDER — BACLOFEN 5 MG PO TABS: ORAL_TABLET | ORAL | 1 refills | Status: DC

## 2023-02-23 NOTE — Telephone Encounter (Signed)
Patient has been scheduled

## 2023-02-23 NOTE — Telephone Encounter (Signed)
Patient called last week with flu like symptoms and hiccups,  Patient had a covid and flu test done both negative .  Daughter stated patient was told to do some exercises and to call back if there was no improvement , patient still has the hiccups and daughter stated while having  hiccups patient has trouble breathing, he can not breath for seconds.  Daughter would like to know if patient can get an appointment to be seen.

## 2023-02-23 NOTE — Progress Notes (Signed)
    Subjective:    Patient ID: [name], male   DOB: [birth year], 77 y.o.   MRN: [id number]   HPI  Called in on Friday, 3 days ago and came in for infection testing for URI sx and previous fever.   Had started Hiccups the night prior as below. Has tried all of the physical interventions below without improvement:    Biting into Lemon Breath holding Holding legs to chest Cold water exposure Gargling with cold water  Started Omeprazole 3 days ago.  Not clear he is taking on empty stomach.   No change in frequency of hiccups episodes and works up to a severe episode--can't breathe when severe--lasts 1 minute   Hiccups started  Thursday evening or 4 days ago.   No cough or dyspnea when not having an episode of hiccups. He did have upper airway congestion, fever 2 days before the hiccups started.  He was taking Dayquil and Nyquil at time when came in and had negative COvID and influenza testing last Friday or 3 days ago.   He is still coughing a bit, not bringing up mucous Denies sinus pressure or pain, No posterior pharyngeal drainage.   No abdominal pain with hiccups. No chest discomfort or sense of acid coming up into chest or throat.    Extended hiccups after suffering COVID for 2 weeks in 01/2019.   States he was discharged on unknown med from hospital to help with hiccups, but unable to find that in discharge summary.  Able to sleep--hiccups not keeping him up.   .    Current Meds  Medication Sig   lisinopril-hydrochlorothiazide (ZESTORETIC) 20-12.5 MG tablet Take 1 tablet by mouth once daily   omeprazole (PRILOSEC) 10 MG capsule Take 20 mg by mouth daily.   No Known Allergies   Review of Systems    Objective:   BP 134/80 (BP Location: Left Arm, Patient Position: Sitting, Cuff Size: Normal)   Pulse (!) 106   Resp 16   Ht 5\' 6"  (1.676 m)   Wt 174 lb (78.9 kg)   BMI 28.08 kg/m   Physical Exam Appears tired. HEENT:  PERRL, EOMI, TMs pearly gray,  nasal mucosa swollen on right with erythema and clear discharge.  No swelling of left nasal mucosa Throat without injection Rare mucousy cough Neck:  Supple, No adenopathy Chest:  CTA CV:  RRR without murmur or rub.  Radial pulses normal and equal Abd:  S NT, No HSM or mass, + BS LE:  No edema  Assessment & Plan  Intractable hiccups:  Continue omeprazole and make sure on empty stomach and 1/2 hour before eating or other meds..  Reflux precautions.  Add Baclofen 5-10 mg every 8 hours for 2 days and if controlled, as needed thereafter.   Call if new symptoms.

## 2023-04-30 ENCOUNTER — Other Ambulatory Visit: Payer: Self-pay | Admitting: Internal Medicine

## 2023-05-01 ENCOUNTER — Other Ambulatory Visit: Payer: Self-pay

## 2023-05-01 ENCOUNTER — Telehealth: Payer: Self-pay

## 2023-05-01 DIAGNOSIS — C61 Malignant neoplasm of prostate: Secondary | ICD-10-CM

## 2023-05-01 DIAGNOSIS — R7303 Prediabetes: Secondary | ICD-10-CM

## 2023-05-01 DIAGNOSIS — Z9189 Other specified personal risk factors, not elsewhere classified: Secondary | ICD-10-CM

## 2023-05-01 DIAGNOSIS — E782 Mixed hyperlipidemia: Secondary | ICD-10-CM

## 2023-05-01 DIAGNOSIS — Z79899 Other long term (current) drug therapy: Secondary | ICD-10-CM

## 2023-05-01 NOTE — Telephone Encounter (Signed)
 Patient would like a dental referral through [facility] for cleaning.

## 2023-05-02 LAB — COMPREHENSIVE METABOLIC PANEL
ALT: 20 [IU]/L (ref 0–44)
AST: 19 [IU]/L (ref 0–40)
Albumin: 4.4 g/dL (ref 3.8–4.8)
Alkaline Phosphatase: 79 [IU]/L (ref 44–121)
BUN/Creatinine Ratio: 16 (ref 10–24)
BUN: 15 mg/dL (ref 8–27)
Bilirubin Total: 0.8 mg/dL (ref 0.0–1.2)
CO2: 25 mmol/L (ref 20–29)
Calcium: 9.7 mg/dL (ref 8.6–10.2)
Chloride: 105 mmol/L (ref 96–106)
Creatinine, Ser: 0.94 mg/dL (ref 0.76–1.27)
Globulin, Total: 1.9 g/dL (ref 1.5–4.5)
Glucose: 96 mg/dL (ref 70–99)
Potassium: 4.7 mmol/L (ref 3.5–5.2)
Sodium: 142 mmol/L (ref 134–144)
Total Protein: 6.3 g/dL (ref 6.0–8.5)
eGFR: 85 mL/min/{1.73_m2} (ref >59)

## 2023-05-02 LAB — CBC WITH DIFFERENTIAL/PLATELET
Basophils Absolute: 0 10*3/uL (ref 0.0–0.2)
Basos: 0 %
EOS (ABSOLUTE): 0.1 10*3/uL (ref 0.0–0.4)
Eos: 3 %
Hematocrit: 46.6 % (ref 37.5–51.0)
Hemoglobin: 15.8 g/dL (ref 13.0–17.7)
Immature Grans (Abs): 0 10*3/uL (ref 0.0–0.1)
Immature Granulocytes: 1 %
Lymphocytes Absolute: 1.5 10*3/uL (ref 0.7–3.1)
Lymphs: 34 %
MCH: 31.2 pg (ref 26.6–33.0)
MCHC: 33.9 g/dL (ref 31.5–35.7)
MCV: 92 fL (ref 79–97)
Monocytes Absolute: 0.4 10*3/uL (ref 0.1–0.9)
Monocytes: 10 %
Neutrophils Absolute: 2.3 10*3/uL (ref 1.4–7.0)
Neutrophils: 52 %
Platelets: 195 10*3/uL (ref 150–450)
RBC: 5.06 x10E6/uL (ref 4.14–5.80)
RDW: 14.2 % (ref 11.6–15.4)
WBC: 4.4 10*3/uL (ref 3.4–10.8)

## 2023-05-02 LAB — LIPID PANEL W/O CHOL/HDL RATIO
Cholesterol, Total: 184 mg/dL (ref 100–199)
HDL: 43 mg/dL (ref >39)
LDL Chol Calc (NIH): 117 mg/dL — ABNORMAL HIGH (ref 0–99)
Triglycerides: 132 mg/dL (ref 0–149)
VLDL Cholesterol Cal: 24 mg/dL (ref 5–40)

## 2023-05-02 LAB — PSA: Prostate Specific Ag, Serum: 0.3 ng/mL (ref 0.0–4.0)

## 2023-05-02 LAB — HEMOGLOBIN A1C
Est. average glucose Bld gHb Est-mCnc: 126 mg/dL
Hgb A1c MFr Bld: 6 % — ABNORMAL HIGH (ref 4.8–5.6)

## 2023-05-06 ENCOUNTER — Ambulatory Visit: Payer: Self-pay | Admitting: Internal Medicine

## 2023-05-08 ENCOUNTER — Other Ambulatory Visit: Payer: Self-pay

## 2023-05-12 ENCOUNTER — Other Ambulatory Visit: Payer: Self-pay

## 2023-05-12 DIAGNOSIS — C61 Malignant neoplasm of prostate: Secondary | ICD-10-CM

## 2023-05-12 DIAGNOSIS — Z23 Encounter for immunization: Secondary | ICD-10-CM

## 2023-05-13 LAB — PSA: Prostate Specific Ag, Serum: 0.3 ng/mL (ref 0.0–4.0)

## 2023-05-15 ENCOUNTER — Telehealth: Payer: Self-pay

## 2023-05-15 NOTE — Telephone Encounter (Signed)
Patient has been made aware of referral and that he needs to apply for financial assistance.

## 2023-05-15 NOTE — Telephone Encounter (Signed)
[facility] called to inform us that they are referring patient to Margaretmary Dys MD at [facility].   Patient does not have insurance so he will need to apply for [facility] financial assistance.

## 2023-05-18 ENCOUNTER — Ambulatory Visit: Payer: Self-pay | Admitting: Internal Medicine

## 2023-05-19 ENCOUNTER — Telehealth: Payer: Self-pay

## 2023-05-19 DIAGNOSIS — R9721 Rising PSA following treatment for malignant neoplasm of prostate: Secondary | ICD-10-CM

## 2023-05-19 NOTE — Telephone Encounter (Signed)
[facility] has requested that an order get sent for PET-PSMA scan no oral contrast for prostate cancer and rising PSA after prostate cancer treatment. They want [place] to make the order in case [facility] covers it.

## 2023-05-19 NOTE — Telephone Encounter (Signed)
[facility] does not cover that study.

## 2023-05-21 NOTE — Telephone Encounter (Signed)
[facility] and patient have been made aware that the order was sent.

## 2023-05-27 ENCOUNTER — Encounter (HOSPITAL_COMMUNITY)
Admission: RE | Admit: 2023-05-27 | Discharge: 2023-05-27 | Disposition: A | Discharge status: Home / Self Care | Payer: Self-pay | Source: Ambulatory Visit | Attending: Internal Medicine | Admitting: Internal Medicine

## 2023-05-27 DIAGNOSIS — R9721 Rising PSA following treatment for malignant neoplasm of prostate: Secondary | ICD-10-CM | POA: Insufficient documentation

## 2023-05-27 MED ORDER — FLUDEOXYGLUCOSE F - 18 (FDG) INJECTION: 190.4500 | Freq: Once | INTRAVENOUS | Status: DC

## 2023-05-27 MED ORDER — FLOTUFOLASTAT F 18 GALLIUM 296-5846 MBQ/ML IV SOLN
8.9000 | Freq: Once | INTRAVENOUS | Status: AC
  Administered 2023-05-27: 8.7 via INTRAVENOUS

## 2023-06-02 NOTE — Progress Notes (Signed)
[facility] Nursing interview for a diagnosis of Prostate cancer (HCC).  Patient identity verified x2.   Patient reports doing well. Patient denies all related issues at this time.  Meaningful use complete.  I-PSS score- 2 Mild SHIM score- 3- No sexual activity within the last 6 months. Urinary Management medication(s) None Urology appointment date- 06/30/23 with Dr. Cardell Peach at [facility]  Vitals- BP (!) 158/74 (BP Location: Right Arm, Patient Position: Sitting, Cuff Size: Large)   Pulse 81   Temp (!) 97.2 F (36.2 C) (Temporal)   Resp 20   Ht 5\' 6"  (1.676 m)   Wt 183 lb 12.8 oz (83.4 kg)   SpO2 100%   BMI 29.67 kg/m   This concludes the interaction.  Ruel Favors, LPN

## 2023-06-04 ENCOUNTER — Ambulatory Visit
Admission: RE | Admit: 2023-06-04 | Discharge: 2023-06-04 | Disposition: A | Discharge status: Home / Self Care | Payer: Self-pay | Source: Ambulatory Visit | Attending: Radiation Oncology | Admitting: Radiation Oncology

## 2023-06-04 ENCOUNTER — Encounter: Payer: Self-pay | Admitting: Urology

## 2023-06-04 ENCOUNTER — Ambulatory Visit
Admission: RE | Admit: 2023-06-04 | Discharge: 2023-06-04 | Disposition: A | Discharge status: Home / Self Care | Payer: Self-pay | Source: Ambulatory Visit | Attending: Urology | Admitting: Urology

## 2023-06-04 ENCOUNTER — Encounter: Payer: Self-pay | Admitting: Radiation Oncology

## 2023-06-04 ENCOUNTER — Telehealth: Payer: Self-pay | Admitting: *Deleted

## 2023-06-04 VITALS — BP 158/74 | HR 81 | Temp 97.2°F | Resp 20 | Ht 66.0 in | Wt 183.8 lb

## 2023-06-04 DIAGNOSIS — E785 Hyperlipidemia, unspecified: Secondary | ICD-10-CM | POA: Insufficient documentation

## 2023-06-04 DIAGNOSIS — C61 Malignant neoplasm of prostate: Secondary | ICD-10-CM

## 2023-06-04 DIAGNOSIS — N281 Cyst of kidney, acquired: Secondary | ICD-10-CM | POA: Insufficient documentation

## 2023-06-04 DIAGNOSIS — Z87891 Personal history of nicotine dependence: Secondary | ICD-10-CM | POA: Insufficient documentation

## 2023-06-04 DIAGNOSIS — I10 Essential (primary) hypertension: Secondary | ICD-10-CM | POA: Insufficient documentation

## 2023-06-04 DIAGNOSIS — Z79899 Other long term (current) drug therapy: Secondary | ICD-10-CM | POA: Insufficient documentation

## 2023-06-04 NOTE — Progress Notes (Signed)
Radiation Oncology         [phone] ________________________________  Outpatient Re-Consultation  Name: [name] MRN: [id number]  Date of Service: 06/04/2023 DOB: 26-Oct-1945  WU:[id number], Lanora Manis, MD  Jannifer Hick, MD   REFERRING PHYSICIAN: Jannifer Hick, MD  DIAGNOSIS: 78 year old man with biochemically recurrent prostate cancer with a current PSA of 0.3, s/p RALP in April 2023 for pT3a N0, Gleason 4+3 adenocarcinoma of the prostate.    ICD-10-CM   1. Malignant neoplasm of prostate (HCC)  C61       HISTORY OF PRESENT ILLNESS: [name] is a 78 y.o. male seen at the request of Dr. Cardell Peach.  We initially met this patient on 06/24/2021 to discuss treatment options for Gleason 3+5 adenocarcinoma the prostate with a PSA of 12.4.  He ultimately elected to proceed with RALP on 08/01/2021.  Surgical pathology confirmed pT3a N0, Gleason 4+3 adenocarcinoma the prostate with extraprostatic extension at the left posterior lateral mid gland but negative margins and no seminal vesicle or lymph node involvement.  His postoperative PSA was undetectable in July 2023 and remained undetectable until October 2024 when it was noted to be 0.2.  He had a repeat PSA on 05/01/2023 that remained elevated at 0.3 and this was confirmed again on 05/12/2023.  He had a PSMA PET scan on 05/27/2023 for disease restaging and this was without any evidence of gross local recurrence or metastatic disease.  He has reviewed the results of his labs and imaging studies with his urologist and has been kindly referred to [place] today to discuss the potential role for salvage radiotherapy.  His 2 daughters who speak good [place] and a medical Spanish interpreter are present for today's visit to assist with translation.  PREVIOUS RADIATION THERAPY: No  PAST MEDICAL HISTORY:  Past Medical History:  Diagnosis Date   Cancer (HCC)    Dyslipidemia 2018   Elevated PSA, between 10 and less than 20 ng/ml     Hyperlipidemia 2021   Hypertension 2008   Pneumonia    Prostate cancer (HCC) 07/2021   path from radical prostatectomy.  Lymph nodes negative,   Subconjunctival hemorrhage of left eye 08/03/2019      PAST SURGICAL HISTORY: Past Surgical History:  Procedure Laterality Date   HERNIA REPAIR     INGUINAL HERNIA REPAIR Bilateral 11/17/2017   Procedure: LAPAROSCOPIC BILATERAL INGUINAL HERNIA REPAIR;  Surgeon: Axel Filler, MD;  Location: [facility] OR;  Service: General;  Laterality: Bilateral;   INSERTION OF MESH Bilateral 11/17/2017   Procedure: INSERTION OF MESH;  Surgeon: Axel Filler, MD;  Location: [facility] OR;  Service: General;  Laterality: Bilateral;   LYMPHADENECTOMY Bilateral 08/02/2021   Procedure: Hart Carwin, PELVIC;  Surgeon: Jannifer Hick, MD;  Location: [facility] ORS;  Service: Urology;  Laterality: Bilateral;   PROSTATE BIOPSY  2022   ROBOT ASSISTED LAPAROSCOPIC RADICAL PROSTATECTOMY N/A 08/02/2021   Procedure: XI ROBOTIC ASSISTED LAPAROSCOPIC RADICAL PROSTATECTOMY;  Surgeon: Jannifer Hick, MD;  Location: [facility] ORS;  Service: Urology;  Laterality: N/A;    FAMILY HISTORY:  Family History  Problem Relation Age of Onset   Hypertension Sister    Hypertension Sister    Hypertension Sister    Hypertension Brother    Stroke Brother    Hypertension Brother    Alcohol abuse Brother    Cirrhosis Brother        Cause of death   Hypertension Brother    Hypertension Brother    Scoliosis Daughter     SOCIAL  HISTORY:  Social History   Socioeconomic History   Marital status: Divorced    Spouse name: Not on file   Number of children: 3   Years of education: 2 + 1 year college   Highest education level: Not on file  Occupational History   Occupation: Maintenance/custodial work in past-retired  Tobacco Use   Smoking status: Former    Current packs/day: 0.10    Average packs/day: 0.1 packs/day for 2.0 years (0.2 ttl pk-yrs)    Types: Cigarettes    Passive exposure: Past    Smokeless tobacco: Never   Tobacco comments:    States was > 20 years ago  Vaping Use   Vaping status: Never Used  Substance and Sexual Activity   Alcohol use: Not Currently    Alcohol/week: 1.0 standard drink of alcohol    Types: 1 Cans of beer per week    Comment: occasionally   Drug use: No   Sexual activity: Not Currently  Other Topics Concern   Not on file  Social History Narrative   From [place]   Lives with daughter, Marian Sorrow, her husband and her one son.   Social Drivers of Corporate investment banker Strain: Low Risk  (05/06/2022)   Overall Financial Resource Strain (CARDIA)    Difficulty of Paying Living Expenses: Not hard at all  Food Insecurity: No Food Insecurity (06/04/2023)   Hunger Vital Sign    Worried About Running Out of Food in the Last Year: Never true    Ran Out of Food in the Last Year: Never true  Transportation Needs: No Transportation Needs (06/04/2023)   PRAPARE - Administrator, Civil Service (Medical): No    Lack of Transportation (Non-Medical): No  Physical Activity: Not on file  Stress: No Stress Concern Present (04/28/2017)   [employer] of Occupational Health - Occupational Stress Questionnaire    Feeling of Stress : Not at all  Social Connections: Not on file  Intimate Partner Violence: Not At Risk (06/04/2023)   Humiliation, Afraid, Rape, and Kick questionnaire    Fear of Current or Ex-Partner: No    Emotionally Abused: No    Physically Abused: No    Sexually Abused: No    ALLERGIES: Patient has no known allergies.  MEDICATIONS:  Current Outpatient Medications  Medication Sig Dispense Refill   lisinopril-hydrochlorothiazide (ZESTORETIC) 20-12.5 MG tablet Take 1 tablet by mouth once daily 30 tablet 9   Baclofen 5 MG TABS 1-2 tabs every 8 hours as needed for hiccups (Patient not taking: Reported on 06/04/2023) 30 tablet 1   omeprazole (PRILOSEC) 10 MG capsule Take 20 mg by mouth daily. (Patient not taking: Reported on 06/04/2023)      No current facility-administered medications for this encounter.    REVIEW OF SYSTEMS:  On review of systems, the patient reports that he is doing well overall.  He denies any chest pain, shortness of breath, cough, fevers, chills, night sweats, unintended weight changes.  He denies any bowel or bladder disturbances, and denies abdominal pain, nausea or vomiting.  He has regained good bladder control and is no longer using any pads for protection.  He has nocturia x 2 that is not bothersome and otherwise, has no urinary complaints.  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  He denies any new musculoskeletal or joint aches or pains. A complete review of systems is obtained and is otherwise negative.    PHYSICAL EXAM:  Wt Readings from Last 3 Encounters:  06/04/23 183 lb 12.8 oz (83.4 kg)  02/23/23 174 lb (78.9 kg)  11/04/22 183 lb (83 kg)   Temp Readings from Last 3 Encounters:  06/04/23 (!) 97.2 F (36.2 C) (Temporal)  02/23/23 98.7 F (37.1 C) (Oral)  08/03/21 98.5 F (36.9 C) (Oral)   BP Readings from Last 3 Encounters:  06/04/23 (!) 158/74  02/23/23 134/80  11/04/22 128/78   Pulse Readings from Last 3 Encounters:  06/04/23 81  02/23/23 (!) 106  11/04/22 82   Pain Assessment Pain Score: 0-No pain/10  In general this is a well appearing Latino man in no acute distress.  He's alert and oriented x4 and appropriate throughout the examination. Cardiopulmonary assessment is negative for acute distress and he exhibits normal effort.   KPS = 100  100 - Normal; no complaints; no evidence of disease. 90   - Able to carry on normal activity; minor signs or symptoms of disease. 80   - Normal activity with effort; some signs or symptoms of disease. 38   - Cares for self; unable to carry on normal activity or to do active work. 60   - Requires occasional assistance, but is able to care for most of his personal needs. 50   - Requires considerable assistance and  frequent medical care. 40   - Disabled; requires special care and assistance. 30   - Severely disabled; hospital admission is indicated although death not imminent. 20   - Very sick; hospital admission necessary; active supportive treatment necessary. 10   - Moribund; fatal processes progressing rapidly. 0     - Dead  Karnofsky DA, Abelmann WH, Craver LS and [place] [facility] [phone]) The use of the nitrogen mustards in the palliative treatment of carcinoma: with particular reference to bronchogenic carcinoma Cancer 1 [phone]  LABORATORY DATA:  Lab Results  Component Value Date   WBC 4.4 05/01/2023   HGB 15.8 05/01/2023   HCT 46.6 05/01/2023   MCV 92 05/01/2023   PLT 195 05/01/2023   Lab Results  Component Value Date   NA 142 05/01/2023   K 4.7 05/01/2023   CL 105 05/01/2023   CO2 25 05/01/2023   Lab Results  Component Value Date   ALT 20 05/01/2023   AST 19 05/01/2023   ALKPHOS 79 05/01/2023   BILITOT 0.8 05/01/2023     RADIOGRAPHY: NM PET (PSMA) SKULL TO MID THIGH Result Date: 05/28/2023 CLINICAL DATA:  Post prostatectomy.  PSA 0.3. EXAM: NUCLEAR MEDICINE PET SKULL BASE TO THIGH TECHNIQUE: 8.7 mCi F18 Piflufolastat (Pylarify) was injected intravenously. Full-ring PET imaging was performed from the skull base to thigh after the radiotracer. CT data was obtained and used for attenuation correction and anatomic localization. COMPARISON:  CT 06/19/2021 FINDINGS: NECK No radiotracer activity in neck lymph nodes. Incidental CT finding: None. CHEST No radiotracer accumulation within mediastinal or hilar lymph nodes. No suspicious pulmonary nodules on the CT scan. Incidental CT finding: None. ABDOMEN/PELVIS Prostate: No focal activity in prostatectomy bed. Lymph nodes: No abnormal radiotracer accumulation within pelvic or abdominal nodes. Liver: No evidence of liver metastasis. Several hypodense lesions in liver not changed from prior. No radiotracer activity. Incidental CT finding: Large benign  appearing renal cysts are unchanged. SKELETON No focal activity to suggest skeletal metastasis. IMPRESSION: 1. No evidence of prostate cancer recurrence in the prostatectomy bed. 2. No evidence of metastatic adenopathy in the pelvis or periaortic retroperitoneum. 3. No evidence of visceral metastasis or skeletal metastasis. 4. Benign hepatic and renal cysts. Electronically  Signed   By: Genevive Bi M.D.   On: 05/28/2023 10:18      IMPRESSION/PLAN: 1. 78 y.o. man with biochemically recurrent prostate cancer with a current PSA of 0.3, s/p RALP in April 2023 for pT3a N0, Gleason 4+3 adenocarcinoma of the prostate.  Today, we talked to the patient and family about the findings and workup thus far. We discussed the natural history of biochemically recurrent prostate cancer and general treatment, highlighting the role of radiotherapy in the management. We discussed the available radiation techniques, and focused on the details and logistics of delivery.  The recommendation is for a 7.5-week course of daily salvage radiotherapy to the prostatic fossa and pelvic lymph nodes.  We reviewed the anticipated acute and late sequelae associated with radiation in this setting. The patient was encouraged to ask questions that were answered to his stated satisfaction.  At the conclusion of our conversation, the patient is in agreement to proceed with the recommended 7.5-week course of daily salvage radiotherapy to the prostatic fossa and pelvic lymph nodes.  He has freely signed written consent to proceed today in the office and a copy of this document will be placed in his medical record.  He is tentatively scheduled for CT simulation at 3 PM on Friday, 06/12/2023, in anticipation of beginning his daily treatments in the near future.  His daughter provides his transportation and has requested that his treatment time to be as late in the day as possible.  He and his family appear to have a good understanding of his disease  and our treatment recommendations which are of curative intent so we will share our discussion with Dr. Cardell Peach and proceed with treatment planning accordingly.  They know that they are welcome to call at anytime in the interim with any questions or concerns.  We enjoyed meeting with him and his family and look forward to continuing to participate in his care.  We personally spent 60 minutes in this encounter including chart review, reviewing radiological studies, meeting face-to-face with the patient, entering orders and completing documentation.    Marguarite Arbour, PA-C    Margaretmary Dys, MD  [facility]  Radiation Oncology Direct Dial: [phone]  Fax: [phone] [email or web address]  Skype  LinkedIn

## 2023-06-04 NOTE — Progress Notes (Signed)
Pt's niece, Lanora Manis, called to inquire about financial assistance for radiation treatment and with the CT scan.  I gave her the number to [facility] customer service to discuss financial assistance for the CT scan and I emailed Adrienne in the radiation dept requesting she call the pt's niece regarding possible copay assistance for radiation treatment and to Lauren to forward to the appropriate social worker for possible additional assistance.

## 2023-06-04 NOTE — Telephone Encounter (Signed)
[name] and daughters in office requesting assistance with medical bills, needing orange card.  Denied form for Accommodation, Disability, FMLA claim.  "He has cancer and increased medical expenses"  Provided business card for financial advocate to connect with to discuss needs,

## 2023-06-04 NOTE — Telephone Encounter (Signed)
Referral has been sent  06/01/2023.

## 2023-06-05 DIAGNOSIS — C61 Malignant neoplasm of prostate: Secondary | ICD-10-CM

## 2023-06-05 NOTE — Progress Notes (Signed)
RN spoke with patient's daughter using interpreter services to assess any barriers prior to his upcoming salvage radiation treatment for his biochemically recurrent prostate cancer.   Patient's daughter voiced concerns with finances due to being underinsured.  Financial advocates have already been notified, and I will also place a referral to LCSW.  Patient's daughter will be provided transportation, however, needs appointments as last as possible.  I will communicate with Rad Onc to see if this can be accommodated and offered transportation assistance.    Direct number provided for any future needs that may arise.

## 2023-06-08 ENCOUNTER — Telehealth: Payer: Self-pay

## 2023-06-08 NOTE — Telephone Encounter (Signed)
[facility] Clinical Social Work  Clinical Social Work was referred by Statistician for assessment of psychosocial needs.  Clinical Social Worker attempted to contact patient by phone via interpreter to offer support and assess for needs. CSW was unable to reach patient, left vm with direct contact.     Marguerita Merles, LCSW  Clinical Social Worker [facility]

## 2023-06-10 ENCOUNTER — Ambulatory Visit
Admission: RE | Admit: 2023-06-10 | Discharge: 2023-06-10 | Disposition: A | Discharge status: Home / Self Care | Payer: Self-pay | Source: Ambulatory Visit | Attending: Radiation Oncology | Admitting: Radiation Oncology

## 2023-06-10 ENCOUNTER — Telehealth: Payer: Self-pay

## 2023-06-10 DIAGNOSIS — C61 Malignant neoplasm of prostate: Secondary | ICD-10-CM | POA: Insufficient documentation

## 2023-06-10 NOTE — Progress Notes (Signed)
  Radiation Oncology         [phone] ________________________________  Name: [name] MRN: [id number]  Date: 06/10/2023  DOB: 26-Feb-1946  SIMULATION AND TREATMENT PLANNING NOTE    ICD-10-CM   1. Biochemically recurrent malignant neoplasm of prostate [facility])  C61    R97.21       DIAGNOSIS:  78 year old man with biochemically recurrent prostate cancer with a current PSA of 0.3, s/p RALP in April 2023 for pT3a N0, Gleason 4+3 adenocarcinoma of the prostate.   NARRATIVE:  The patient was brought to the CT Simulation planning suite.  Identity was confirmed.  All relevant records and images related to the planned course of therapy were reviewed.  The patient freely provided informed written consent to proceed with treatment after reviewing the details related to the planned course of therapy. The consent form was witnessed and verified by the simulation staff.  Then, the patient was set-up in a stable reproducible supine position for radiation therapy.  A vacuum lock pillow device was custom fabricated to position his legs in a reproducible immobilized position.  Then, I performed a urethrogram under sterile conditions to identify the prostatic apex.  CT images were obtained.  Surface markings were placed.  The CT images were loaded into the planning software.  Then the prostate target and avoidance structures including the rectum, bladder, bowel and hips were contoured.  Treatment planning then occurred.  The radiation prescription was entered and confirmed.  A total of one complex treatment devices was fabricated. I have requested : Intensity Modulated Radiotherapy (IMRT) is medically necessary for this case for the following reason:  Rectal sparing.Marland Kitchen  PLAN:   The prostate fossa and pelvic lymph nodes will initially be treated to 45 Gy in 25 fractions of 1.8 Gy followed by a boost to the fossa only, to 68.4 Gy with 13 additional fractions of 1.8 Gy    ________________________________  Artist Pais Kathrynn Running, M.D.

## 2023-06-10 NOTE — Telephone Encounter (Signed)
[facility] Clinical Social Work  Clinical Social Work was referred by Statistician for assessment of psychosocial needs.  Clinical Social Worker attempted to reach patient's caregivers by phone 2x via interpreter. CSW left vm x2 and will attempt again.  Marguerita Merles, LCSW  Clinical Social Worker [facility]

## 2023-06-11 ENCOUNTER — Encounter: Payer: Self-pay | Admitting: *Deleted

## 2023-06-11 NOTE — Progress Notes (Signed)
[facility] CSW Progress Note  Clinical Child psychotherapist received email forwarded email from patient's daughter Marian Sorrow attempting to submit financial assistance application.   CSW informed Ms. Marian Sorrow of brief conversation on 2/19 with alternative contact Elizabeth,who inquired if orange card covers radiation treatment. CSW referred patient to the caseworker that assisted with the application to double check coverage. CSW discussed financial assistance applications that can be completed if the orange card does not cover treatment.   CSW offered a joint call with Ms. Marian Sorrow and Lanora Manis to aide in future communication. CSW will submit the application on patient's behalf once documents are received.   Marguerita Merles, LCSW Clinical Social Worker [facility]

## 2023-06-11 NOTE — Progress Notes (Signed)
Received email from patient's daughter. Forwarded to [place] in social work.

## 2023-06-12 ENCOUNTER — Inpatient Hospital Stay: Payer: Self-pay | Attending: Radiation Oncology

## 2023-06-12 NOTE — Progress Notes (Signed)
[facility] Clinical Social Work  Clinical Social Work was referred by medical provider for assessment of psychosocial needs.  Clinical Social Worker contacted caregiver by phone to offer support and assess for needs.  CSW spoke with patient's daughters and discussed Patient Financial Assistance application, patient's daughters will submit the documentation to CSW via email.   Marguerita Merles, LCSW  Clinical Social Worker [facility]

## 2023-06-15 NOTE — Progress Notes (Signed)
 [facility] CSW Progress Note  Clinical Child psychotherapist  submitted patient's [employer] on patient's behalf.   CSW informed patient's daughter of submission via email, as requested by patient.     Marguerita Merles, LCSW Clinical Social Worker [facility]

## 2023-06-22 ENCOUNTER — Encounter: Payer: Self-pay | Admitting: Radiation Oncology

## 2023-06-22 ENCOUNTER — Telehealth: Payer: Self-pay

## 2023-06-22 NOTE — Telephone Encounter (Signed)
 [facility] CSW Progress Note  Clinical Social Worker contacted patient's daughter for status on Patient Technical brewer for [employer]. CSW confirmed application was sent on 2/24, patient is still awaiting status letter. Referral sent to Patient Financial Specialist for [employer].    Marguerita Merles, LCSW Clinical Social Worker [facility]

## 2023-06-22 NOTE — Progress Notes (Signed)
Pt is approved for the $1000 grant.  

## 2023-06-22 NOTE — Progress Notes (Signed)
 Pt is also approved for the $200  prostate grant.

## 2023-06-24 ENCOUNTER — Ambulatory Visit
Admission: RE | Admit: 2023-06-24 | Discharge: 2023-06-24 | Disposition: A | Discharge status: Home / Self Care | Payer: Self-pay | Source: Ambulatory Visit | Attending: Radiation Oncology | Admitting: Radiation Oncology

## 2023-06-24 ENCOUNTER — Other Ambulatory Visit: Payer: Self-pay

## 2023-06-24 DIAGNOSIS — C61 Malignant neoplasm of prostate: Secondary | ICD-10-CM | POA: Insufficient documentation

## 2023-06-24 LAB — RAD ONC ARIA SESSION SUMMARY
Course Elapsed Days: 0
Plan Fractions Treated to Date: 1
Plan Prescribed Dose Per Fraction: 1.8 Gy
Plan Total Fractions Prescribed: 25
Plan Total Prescribed Dose: 45 Gy
Reference Point Dosage Given to Date: 1.8 Gy
Reference Point Session Dosage Given: 1.8 Gy
Session Number: 1

## 2023-06-25 ENCOUNTER — Other Ambulatory Visit: Payer: Self-pay

## 2023-06-25 ENCOUNTER — Ambulatory Visit
Admission: RE | Admit: 2023-06-25 | Discharge: 2023-06-25 | Disposition: A | Discharge status: Home / Self Care | Payer: Self-pay | Source: Ambulatory Visit | Attending: Radiation Oncology | Admitting: Radiation Oncology

## 2023-06-25 LAB — RAD ONC ARIA SESSION SUMMARY
Course Elapsed Days: 1
Plan Fractions Treated to Date: 2
Plan Prescribed Dose Per Fraction: 1.8 Gy
Plan Total Fractions Prescribed: 25
Plan Total Prescribed Dose: 45 Gy
Reference Point Dosage Given to Date: 3.6 Gy
Reference Point Session Dosage Given: 1.8 Gy
Session Number: 2

## 2023-06-26 ENCOUNTER — Other Ambulatory Visit: Payer: Self-pay

## 2023-06-26 ENCOUNTER — Ambulatory Visit
Admission: RE | Admit: 2023-06-26 | Discharge: 2023-06-26 | Disposition: A | Discharge status: Home / Self Care | Payer: Self-pay | Source: Ambulatory Visit | Attending: Radiation Oncology | Admitting: Radiation Oncology

## 2023-06-26 LAB — RAD ONC ARIA SESSION SUMMARY
Course Elapsed Days: 2
Plan Fractions Treated to Date: 3
Plan Prescribed Dose Per Fraction: 1.8 Gy
Plan Total Fractions Prescribed: 25
Plan Total Prescribed Dose: 45 Gy
Reference Point Dosage Given to Date: 5.4 Gy
Reference Point Session Dosage Given: 1.8 Gy
Session Number: 3

## 2023-06-29 ENCOUNTER — Other Ambulatory Visit: Payer: Self-pay

## 2023-06-29 ENCOUNTER — Ambulatory Visit
Admission: RE | Admit: 2023-06-29 | Discharge: 2023-06-29 | Disposition: A | Discharge status: Home / Self Care | Payer: Self-pay | Source: Ambulatory Visit | Attending: Radiation Oncology

## 2023-06-29 LAB — RAD ONC ARIA SESSION SUMMARY
Course Elapsed Days: 5
Plan Fractions Treated to Date: 4
Plan Prescribed Dose Per Fraction: 1.8 Gy
Plan Total Fractions Prescribed: 25
Plan Total Prescribed Dose: 45 Gy
Reference Point Dosage Given to Date: 7.2 Gy
Reference Point Session Dosage Given: 1.8 Gy
Session Number: 4

## 2023-06-30 ENCOUNTER — Other Ambulatory Visit: Payer: Self-pay

## 2023-06-30 ENCOUNTER — Ambulatory Visit
Admission: RE | Admit: 2023-06-30 | Discharge: 2023-06-30 | Disposition: A | Discharge status: Home / Self Care | Payer: Self-pay | Source: Ambulatory Visit | Attending: Radiation Oncology | Admitting: Radiation Oncology

## 2023-06-30 LAB — RAD ONC ARIA SESSION SUMMARY
Course Elapsed Days: 6
Plan Fractions Treated to Date: 5
Plan Prescribed Dose Per Fraction: 1.8 Gy
Plan Total Fractions Prescribed: 25
Plan Total Prescribed Dose: 45 Gy
Reference Point Dosage Given to Date: 9 Gy
Reference Point Session Dosage Given: 1.8 Gy
Session Number: 5

## 2023-07-01 ENCOUNTER — Ambulatory Visit
Admission: RE | Admit: 2023-07-01 | Discharge: 2023-07-01 | Disposition: A | Discharge status: Home / Self Care | Payer: Self-pay | Source: Ambulatory Visit | Attending: Radiation Oncology | Admitting: Radiation Oncology

## 2023-07-01 ENCOUNTER — Other Ambulatory Visit: Payer: Self-pay

## 2023-07-01 LAB — RAD ONC ARIA SESSION SUMMARY
Course Elapsed Days: 7
Plan Fractions Treated to Date: 6
Plan Prescribed Dose Per Fraction: 1.8 Gy
Plan Total Fractions Prescribed: 25
Plan Total Prescribed Dose: 45 Gy
Reference Point Dosage Given to Date: 10.8 Gy
Reference Point Session Dosage Given: 1.8 Gy
Session Number: 6

## 2023-07-02 ENCOUNTER — Ambulatory Visit
Admission: RE | Admit: 2023-07-02 | Discharge: 2023-07-02 | Discharge status: Home / Self Care | Payer: Self-pay | Source: Ambulatory Visit | Attending: Radiation Oncology

## 2023-07-02 ENCOUNTER — Other Ambulatory Visit: Payer: Self-pay

## 2023-07-02 LAB — RAD ONC ARIA SESSION SUMMARY
Course Elapsed Days: 8
Plan Fractions Treated to Date: 7
Plan Prescribed Dose Per Fraction: 1.8 Gy
Plan Total Fractions Prescribed: 25
Plan Total Prescribed Dose: 45 Gy
Reference Point Dosage Given to Date: 12.6 Gy
Reference Point Session Dosage Given: 1.8 Gy
Session Number: 7

## 2023-07-03 ENCOUNTER — Ambulatory Visit
Admission: RE | Admit: 2023-07-03 | Discharge: 2023-07-03 | Disposition: A | Discharge status: Home / Self Care | Payer: Self-pay | Source: Ambulatory Visit | Attending: Radiation Oncology | Admitting: Radiation Oncology

## 2023-07-03 ENCOUNTER — Other Ambulatory Visit: Payer: Self-pay

## 2023-07-03 LAB — RAD ONC ARIA SESSION SUMMARY
Course Elapsed Days: 9
Plan Fractions Treated to Date: 8
Plan Prescribed Dose Per Fraction: 1.8 Gy
Plan Total Fractions Prescribed: 25
Plan Total Prescribed Dose: 45 Gy
Reference Point Dosage Given to Date: 14.4 Gy
Reference Point Session Dosage Given: 1.8 Gy
Session Number: 8

## 2023-07-06 ENCOUNTER — Ambulatory Visit
Admission: RE | Admit: 2023-07-06 | Discharge: 2023-07-06 | Disposition: A | Discharge status: Home / Self Care | Payer: Self-pay | Source: Ambulatory Visit | Attending: Radiation Oncology | Admitting: Radiation Oncology

## 2023-07-06 ENCOUNTER — Other Ambulatory Visit: Payer: Self-pay

## 2023-07-06 LAB — RAD ONC ARIA SESSION SUMMARY
Course Elapsed Days: 12
Plan Fractions Treated to Date: 9
Plan Prescribed Dose Per Fraction: 1.8 Gy
Plan Total Fractions Prescribed: 25
Plan Total Prescribed Dose: 45 Gy
Reference Point Dosage Given to Date: 16.2 Gy
Reference Point Session Dosage Given: 1.8 Gy
Session Number: 9

## 2023-07-07 ENCOUNTER — Other Ambulatory Visit: Payer: Self-pay

## 2023-07-07 ENCOUNTER — Ambulatory Visit
Admission: RE | Admit: 2023-07-07 | Discharge: 2023-07-07 | Disposition: A | Discharge status: Home / Self Care | Payer: Self-pay | Source: Ambulatory Visit | Attending: Radiation Oncology | Admitting: Radiation Oncology

## 2023-07-07 LAB — RAD ONC ARIA SESSION SUMMARY
Course Elapsed Days: 13
Plan Fractions Treated to Date: 10
Plan Prescribed Dose Per Fraction: 1.8 Gy
Plan Total Fractions Prescribed: 25
Plan Total Prescribed Dose: 45 Gy
Reference Point Dosage Given to Date: 18 Gy
Reference Point Session Dosage Given: 1.8 Gy
Session Number: 10

## 2023-07-08 ENCOUNTER — Ambulatory Visit
Admission: RE | Admit: 2023-07-08 | Discharge: 2023-07-08 | Disposition: A | Discharge status: Home / Self Care | Payer: Self-pay | Source: Ambulatory Visit | Attending: Radiation Oncology | Admitting: Radiation Oncology

## 2023-07-08 ENCOUNTER — Other Ambulatory Visit: Payer: Self-pay

## 2023-07-08 LAB — RAD ONC ARIA SESSION SUMMARY
Course Elapsed Days: 14
Plan Fractions Treated to Date: 11
Plan Prescribed Dose Per Fraction: 1.8 Gy
Plan Total Fractions Prescribed: 25
Plan Total Prescribed Dose: 45 Gy
Reference Point Dosage Given to Date: 19.8 Gy
Reference Point Session Dosage Given: 1.8 Gy
Session Number: 11

## 2023-07-09 ENCOUNTER — Ambulatory Visit
Admission: RE | Admit: 2023-07-09 | Discharge: 2023-07-09 | Discharge status: Home / Self Care | Payer: Self-pay | Source: Ambulatory Visit | Attending: Radiation Oncology

## 2023-07-09 ENCOUNTER — Other Ambulatory Visit: Payer: Self-pay

## 2023-07-09 LAB — RAD ONC ARIA SESSION SUMMARY
Course Elapsed Days: 15
Plan Fractions Treated to Date: 12
Plan Prescribed Dose Per Fraction: 1.8 Gy
Plan Total Fractions Prescribed: 25
Plan Total Prescribed Dose: 45 Gy
Reference Point Dosage Given to Date: 21.6 Gy
Reference Point Session Dosage Given: 1.8 Gy
Session Number: 12

## 2023-07-10 ENCOUNTER — Ambulatory Visit
Admission: RE | Admit: 2023-07-10 | Discharge: 2023-07-10 | Disposition: A | Discharge status: Home / Self Care | Payer: Self-pay | Source: Ambulatory Visit | Attending: Radiation Oncology | Admitting: Radiation Oncology

## 2023-07-10 ENCOUNTER — Other Ambulatory Visit: Payer: Self-pay

## 2023-07-10 LAB — RAD ONC ARIA SESSION SUMMARY
Course Elapsed Days: 16
Plan Fractions Treated to Date: 13
Plan Prescribed Dose Per Fraction: 1.8 Gy
Plan Total Fractions Prescribed: 25
Plan Total Prescribed Dose: 45 Gy
Reference Point Dosage Given to Date: 23.4 Gy
Reference Point Session Dosage Given: 1.8 Gy
Session Number: 13

## 2023-07-13 ENCOUNTER — Ambulatory Visit
Admission: RE | Admit: 2023-07-13 | Discharge: 2023-07-13 | Disposition: A | Discharge status: Home / Self Care | Payer: Self-pay | Source: Ambulatory Visit | Attending: Radiation Oncology | Admitting: Radiation Oncology

## 2023-07-13 ENCOUNTER — Other Ambulatory Visit: Payer: Self-pay

## 2023-07-13 LAB — RAD ONC ARIA SESSION SUMMARY
Course Elapsed Days: 19
Plan Fractions Treated to Date: 14
Plan Prescribed Dose Per Fraction: 1.8 Gy
Plan Total Fractions Prescribed: 25
Plan Total Prescribed Dose: 45 Gy
Reference Point Dosage Given to Date: 25.2 Gy
Reference Point Session Dosage Given: 1.8 Gy
Session Number: 14

## 2023-07-14 ENCOUNTER — Other Ambulatory Visit: Payer: Self-pay

## 2023-07-14 ENCOUNTER — Ambulatory Visit
Admission: RE | Admit: 2023-07-14 | Discharge: 2023-07-14 | Disposition: A | Discharge status: Home / Self Care | Payer: Self-pay | Source: Ambulatory Visit | Attending: Radiation Oncology | Admitting: Radiation Oncology

## 2023-07-14 LAB — RAD ONC ARIA SESSION SUMMARY
Course Elapsed Days: 20
Plan Fractions Treated to Date: 15
Plan Prescribed Dose Per Fraction: 1.8 Gy
Plan Total Fractions Prescribed: 25
Plan Total Prescribed Dose: 45 Gy
Reference Point Dosage Given to Date: 27 Gy
Reference Point Session Dosage Given: 1.8 Gy
Session Number: 15

## 2023-07-15 ENCOUNTER — Other Ambulatory Visit: Payer: Self-pay

## 2023-07-15 ENCOUNTER — Ambulatory Visit
Admission: RE | Admit: 2023-07-15 | Discharge: 2023-07-15 | Disposition: A | Discharge status: Home / Self Care | Payer: Self-pay | Source: Ambulatory Visit | Attending: Radiation Oncology | Admitting: Radiation Oncology

## 2023-07-15 LAB — RAD ONC ARIA SESSION SUMMARY
Course Elapsed Days: 21
Plan Fractions Treated to Date: 16
Plan Prescribed Dose Per Fraction: 1.8 Gy
Plan Total Fractions Prescribed: 25
Plan Total Prescribed Dose: 45 Gy
Reference Point Dosage Given to Date: 28.8 Gy
Reference Point Session Dosage Given: 1.8 Gy
Session Number: 16

## 2023-07-16 ENCOUNTER — Ambulatory Visit
Admission: RE | Admit: 2023-07-16 | Discharge: 2023-07-16 | Disposition: A | Discharge status: Home / Self Care | Payer: Self-pay | Source: Ambulatory Visit | Attending: Radiation Oncology | Admitting: Radiation Oncology

## 2023-07-16 ENCOUNTER — Other Ambulatory Visit: Payer: Self-pay

## 2023-07-16 ENCOUNTER — Ambulatory Visit
Admission: RE | Admit: 2023-07-16 | Discharge: 2023-07-16 | Disposition: A | Discharge status: Home / Self Care | Payer: Self-pay | Source: Ambulatory Visit | Attending: Radiation Oncology

## 2023-07-16 LAB — RAD ONC ARIA SESSION SUMMARY
Course Elapsed Days: 22
Plan Fractions Treated to Date: 17
Plan Prescribed Dose Per Fraction: 1.8 Gy
Plan Total Fractions Prescribed: 25
Plan Total Prescribed Dose: 45 Gy
Reference Point Dosage Given to Date: 30.6 Gy
Reference Point Session Dosage Given: 1.8 Gy
Session Number: 17

## 2023-07-17 ENCOUNTER — Ambulatory Visit: Payer: Self-pay

## 2023-07-17 ENCOUNTER — Other Ambulatory Visit: Payer: Self-pay

## 2023-07-17 ENCOUNTER — Ambulatory Visit
Admission: RE | Admit: 2023-07-17 | Discharge: 2023-07-17 | Disposition: A | Discharge status: Home / Self Care | Payer: Self-pay | Source: Ambulatory Visit | Attending: Radiation Oncology | Admitting: Radiation Oncology

## 2023-07-17 LAB — RAD ONC ARIA SESSION SUMMARY
Course Elapsed Days: 23
Plan Fractions Treated to Date: 18
Plan Prescribed Dose Per Fraction: 1.8 Gy
Plan Total Fractions Prescribed: 25
Plan Total Prescribed Dose: 45 Gy
Reference Point Dosage Given to Date: 32.4 Gy
Reference Point Session Dosage Given: 1.8 Gy
Session Number: 18

## 2023-07-20 ENCOUNTER — Ambulatory Visit
Admission: RE | Admit: 2023-07-20 | Discharge: 2023-07-20 | Disposition: A | Discharge status: Home / Self Care | Payer: Self-pay | Source: Ambulatory Visit | Attending: Radiation Oncology

## 2023-07-20 ENCOUNTER — Other Ambulatory Visit: Payer: Self-pay

## 2023-07-20 LAB — RAD ONC ARIA SESSION SUMMARY
Course Elapsed Days: 26
Plan Fractions Treated to Date: 19
Plan Prescribed Dose Per Fraction: 1.8 Gy
Plan Total Fractions Prescribed: 25
Plan Total Prescribed Dose: 45 Gy
Reference Point Dosage Given to Date: 34.2 Gy
Reference Point Session Dosage Given: 1.8 Gy
Session Number: 19

## 2023-07-21 ENCOUNTER — Ambulatory Visit
Admission: RE | Admit: 2023-07-21 | Discharge: 2023-07-21 | Disposition: A | Discharge status: Home / Self Care | Payer: Self-pay | Source: Ambulatory Visit | Attending: Radiation Oncology | Admitting: Radiation Oncology

## 2023-07-21 ENCOUNTER — Other Ambulatory Visit: Payer: Self-pay

## 2023-07-21 DIAGNOSIS — Z510 Encounter for antineoplastic radiation therapy: Secondary | ICD-10-CM | POA: Insufficient documentation

## 2023-07-21 DIAGNOSIS — C61 Malignant neoplasm of prostate: Secondary | ICD-10-CM | POA: Insufficient documentation

## 2023-07-21 LAB — RAD ONC ARIA SESSION SUMMARY
Course Elapsed Days: 27
Plan Fractions Treated to Date: 20
Plan Prescribed Dose Per Fraction: 1.8 Gy
Plan Total Fractions Prescribed: 25
Plan Total Prescribed Dose: 45 Gy
Reference Point Dosage Given to Date: 36 Gy
Reference Point Session Dosage Given: 1.8 Gy
Session Number: 20

## 2023-07-22 ENCOUNTER — Other Ambulatory Visit: Payer: Self-pay

## 2023-07-22 ENCOUNTER — Ambulatory Visit
Admission: RE | Admit: 2023-07-22 | Discharge: 2023-07-22 | Disposition: A | Discharge status: Home / Self Care | Payer: Self-pay | Source: Ambulatory Visit | Attending: Radiation Oncology

## 2023-07-22 LAB — RAD ONC ARIA SESSION SUMMARY
Course Elapsed Days: 28
Plan Fractions Treated to Date: 21
Plan Prescribed Dose Per Fraction: 1.8 Gy
Plan Total Fractions Prescribed: 25
Plan Total Prescribed Dose: 45 Gy
Reference Point Dosage Given to Date: 37.8 Gy
Reference Point Session Dosage Given: 1.8 Gy
Session Number: 21

## 2023-07-23 ENCOUNTER — Ambulatory Visit
Admission: RE | Admit: 2023-07-23 | Discharge: 2023-07-23 | Disposition: A | Discharge status: Home / Self Care | Payer: Self-pay | Source: Ambulatory Visit | Attending: Radiation Oncology | Admitting: Radiation Oncology

## 2023-07-23 ENCOUNTER — Ambulatory Visit: Payer: Self-pay

## 2023-07-23 ENCOUNTER — Other Ambulatory Visit: Payer: Self-pay

## 2023-07-23 LAB — RAD ONC ARIA SESSION SUMMARY
Course Elapsed Days: 29
Plan Fractions Treated to Date: 22
Plan Prescribed Dose Per Fraction: 1.8 Gy
Plan Total Fractions Prescribed: 25
Plan Total Prescribed Dose: 45 Gy
Reference Point Dosage Given to Date: 39.6 Gy
Reference Point Session Dosage Given: 1.8 Gy
Session Number: 22

## 2023-07-24 ENCOUNTER — Ambulatory Visit: Payer: Self-pay

## 2023-07-24 ENCOUNTER — Other Ambulatory Visit: Payer: Self-pay

## 2023-07-24 ENCOUNTER — Ambulatory Visit
Admission: RE | Admit: 2023-07-24 | Discharge: 2023-07-24 | Disposition: A | Discharge status: Home / Self Care | Payer: Self-pay | Source: Ambulatory Visit | Attending: Radiation Oncology | Admitting: Radiation Oncology

## 2023-07-24 ENCOUNTER — Other Ambulatory Visit: Payer: Self-pay | Admitting: Radiation Oncology

## 2023-07-24 LAB — RAD ONC ARIA SESSION SUMMARY
Course Elapsed Days: 30
Plan Fractions Treated to Date: 23
Plan Prescribed Dose Per Fraction: 1.8 Gy
Plan Total Fractions Prescribed: 25
Plan Total Prescribed Dose: 45 Gy
Reference Point Dosage Given to Date: 41.4 Gy
Reference Point Session Dosage Given: 1.8 Gy
Session Number: 23

## 2023-07-24 MED ORDER — TAMSULOSIN HCL 0.4 MG PO CAPS: 0.4000 mg | ORAL_CAPSULE | Freq: Every day | ORAL | 5 refills | Status: AC

## 2023-07-27 ENCOUNTER — Ambulatory Visit
Admission: RE | Admit: 2023-07-27 | Discharge: 2023-07-27 | Disposition: A | Discharge status: Home / Self Care | Payer: Self-pay | Source: Ambulatory Visit | Attending: Radiation Oncology

## 2023-07-27 ENCOUNTER — Other Ambulatory Visit: Payer: Self-pay

## 2023-07-27 LAB — RAD ONC ARIA SESSION SUMMARY
Course Elapsed Days: 33
Plan Fractions Treated to Date: 24
Plan Prescribed Dose Per Fraction: 1.8 Gy
Plan Total Fractions Prescribed: 25
Plan Total Prescribed Dose: 45 Gy
Reference Point Dosage Given to Date: 43.2 Gy
Reference Point Session Dosage Given: 1.8 Gy
Session Number: 24

## 2023-07-28 ENCOUNTER — Other Ambulatory Visit: Payer: Self-pay

## 2023-07-28 ENCOUNTER — Ambulatory Visit
Admission: RE | Admit: 2023-07-28 | Discharge: 2023-07-28 | Disposition: A | Discharge status: Home / Self Care | Payer: Self-pay | Source: Ambulatory Visit | Attending: Radiation Oncology | Admitting: Radiation Oncology

## 2023-07-28 LAB — RAD ONC ARIA SESSION SUMMARY
Course Elapsed Days: 34
Plan Fractions Treated to Date: 25
Plan Prescribed Dose Per Fraction: 1.8 Gy
Plan Total Fractions Prescribed: 25
Plan Total Prescribed Dose: 45 Gy
Reference Point Dosage Given to Date: 45 Gy
Reference Point Session Dosage Given: 1.8 Gy
Session Number: 25

## 2023-07-29 ENCOUNTER — Ambulatory Visit
Admission: RE | Admit: 2023-07-29 | Discharge: 2023-07-29 | Disposition: A | Discharge status: Home / Self Care | Payer: Self-pay | Source: Ambulatory Visit | Attending: Radiation Oncology

## 2023-07-29 ENCOUNTER — Other Ambulatory Visit: Payer: Self-pay

## 2023-07-29 LAB — RAD ONC ARIA SESSION SUMMARY
Course Elapsed Days: 35
Plan Fractions Treated to Date: 1
Plan Prescribed Dose Per Fraction: 1.8 Gy
Plan Total Fractions Prescribed: 13
Plan Total Prescribed Dose: 23.4 Gy
Reference Point Dosage Given to Date: 1.8 Gy
Reference Point Session Dosage Given: 1.8 Gy
Session Number: 26

## 2023-07-30 ENCOUNTER — Other Ambulatory Visit: Payer: Self-pay

## 2023-07-30 ENCOUNTER — Ambulatory Visit: Payer: Self-pay

## 2023-07-30 LAB — RAD ONC ARIA SESSION SUMMARY
Course Elapsed Days: 36
Plan Fractions Treated to Date: 2
Plan Prescribed Dose Per Fraction: 1.8 Gy
Plan Total Fractions Prescribed: 13
Plan Total Prescribed Dose: 23.4 Gy
Reference Point Dosage Given to Date: 3.6 Gy
Reference Point Session Dosage Given: 1.8 Gy
Session Number: 27

## 2023-07-31 ENCOUNTER — Other Ambulatory Visit: Payer: Self-pay

## 2023-07-31 ENCOUNTER — Ambulatory Visit
Admission: RE | Admit: 2023-07-31 | Discharge: 2023-07-31 | Disposition: A | Discharge status: Home / Self Care | Payer: Self-pay | Source: Ambulatory Visit | Attending: Radiation Oncology | Admitting: Radiation Oncology

## 2023-07-31 LAB — RAD ONC ARIA SESSION SUMMARY
Course Elapsed Days: 37
Plan Fractions Treated to Date: 3
Plan Prescribed Dose Per Fraction: 1.8 Gy
Plan Total Fractions Prescribed: 13
Plan Total Prescribed Dose: 23.4 Gy
Reference Point Dosage Given to Date: 5.4 Gy
Reference Point Session Dosage Given: 1.8 Gy
Session Number: 28

## 2023-08-03 ENCOUNTER — Ambulatory Visit
Admission: RE | Admit: 2023-08-03 | Discharge: 2023-08-03 | Disposition: A | Discharge status: Home / Self Care | Payer: Self-pay | Source: Ambulatory Visit | Attending: Radiation Oncology | Admitting: Radiation Oncology

## 2023-08-03 ENCOUNTER — Other Ambulatory Visit: Payer: Self-pay

## 2023-08-03 LAB — RAD ONC ARIA SESSION SUMMARY
Course Elapsed Days: 40
Plan Fractions Treated to Date: 4
Plan Prescribed Dose Per Fraction: 1.8 Gy
Plan Total Fractions Prescribed: 13
Plan Total Prescribed Dose: 23.4 Gy
Reference Point Dosage Given to Date: 7.2 Gy
Reference Point Session Dosage Given: 1.8 Gy
Session Number: 29

## 2023-08-04 ENCOUNTER — Other Ambulatory Visit: Payer: Self-pay

## 2023-08-04 ENCOUNTER — Ambulatory Visit
Admission: RE | Admit: 2023-08-04 | Discharge: 2023-08-04 | Disposition: A | Discharge status: Home / Self Care | Payer: Self-pay | Source: Ambulatory Visit | Attending: Radiation Oncology | Admitting: Radiation Oncology

## 2023-08-04 LAB — RAD ONC ARIA SESSION SUMMARY
Course Elapsed Days: 41
Plan Fractions Treated to Date: 5
Plan Prescribed Dose Per Fraction: 1.8 Gy
Plan Total Fractions Prescribed: 13
Plan Total Prescribed Dose: 23.4 Gy
Reference Point Dosage Given to Date: 9 Gy
Reference Point Session Dosage Given: 1.8 Gy
Session Number: 30

## 2023-08-05 ENCOUNTER — Other Ambulatory Visit: Payer: Self-pay

## 2023-08-05 ENCOUNTER — Ambulatory Visit
Admission: RE | Admit: 2023-08-05 | Discharge: 2023-08-05 | Disposition: A | Discharge status: Home / Self Care | Payer: Self-pay | Source: Ambulatory Visit | Attending: Radiation Oncology | Admitting: Radiation Oncology

## 2023-08-05 LAB — RAD ONC ARIA SESSION SUMMARY
Course Elapsed Days: 42
Plan Fractions Treated to Date: 6
Plan Prescribed Dose Per Fraction: 1.8 Gy
Plan Total Fractions Prescribed: 13
Plan Total Prescribed Dose: 23.4 Gy
Reference Point Dosage Given to Date: 10.8 Gy
Reference Point Session Dosage Given: 1.8 Gy
Session Number: 31

## 2023-08-06 ENCOUNTER — Other Ambulatory Visit: Payer: Self-pay

## 2023-08-06 ENCOUNTER — Ambulatory Visit
Admission: RE | Admit: 2023-08-06 | Discharge: 2023-08-06 | Disposition: A | Discharge status: Home / Self Care | Payer: Self-pay | Source: Ambulatory Visit | Attending: Radiation Oncology | Admitting: Radiation Oncology

## 2023-08-06 LAB — RAD ONC ARIA SESSION SUMMARY
Course Elapsed Days: 43
Plan Fractions Treated to Date: 7
Plan Prescribed Dose Per Fraction: 1.8 Gy
Plan Total Fractions Prescribed: 13
Plan Total Prescribed Dose: 23.4 Gy
Reference Point Dosage Given to Date: 12.6 Gy
Reference Point Session Dosage Given: 1.8 Gy
Session Number: 32

## 2023-08-07 ENCOUNTER — Other Ambulatory Visit: Payer: Self-pay

## 2023-08-07 ENCOUNTER — Ambulatory Visit
Admission: RE | Admit: 2023-08-07 | Discharge: 2023-08-07 | Disposition: A | Discharge status: Home / Self Care | Payer: Self-pay | Source: Ambulatory Visit | Attending: Radiation Oncology | Admitting: Radiation Oncology

## 2023-08-07 LAB — RAD ONC ARIA SESSION SUMMARY
Course Elapsed Days: 44
Plan Fractions Treated to Date: 8
Plan Prescribed Dose Per Fraction: 1.8 Gy
Plan Total Fractions Prescribed: 13
Plan Total Prescribed Dose: 23.4 Gy
Reference Point Dosage Given to Date: 14.4 Gy
Reference Point Session Dosage Given: 1.8 Gy
Session Number: 33

## 2023-08-10 ENCOUNTER — Other Ambulatory Visit: Payer: Self-pay

## 2023-08-10 ENCOUNTER — Ambulatory Visit: Payer: Self-pay

## 2023-08-10 LAB — RAD ONC ARIA SESSION SUMMARY
Course Elapsed Days: 47
Plan Fractions Treated to Date: 9
Plan Prescribed Dose Per Fraction: 1.8 Gy
Plan Total Fractions Prescribed: 13
Plan Total Prescribed Dose: 23.4 Gy
Reference Point Dosage Given to Date: 16.2 Gy
Reference Point Session Dosage Given: 1.8 Gy
Session Number: 34

## 2023-08-11 ENCOUNTER — Other Ambulatory Visit: Payer: Self-pay

## 2023-08-11 ENCOUNTER — Ambulatory Visit
Admission: RE | Admit: 2023-08-11 | Discharge: 2023-08-11 | Disposition: A | Discharge status: Home / Self Care | Payer: Self-pay | Source: Ambulatory Visit | Attending: Radiation Oncology | Admitting: Radiation Oncology

## 2023-08-11 LAB — RAD ONC ARIA SESSION SUMMARY
Course Elapsed Days: 48
Plan Fractions Treated to Date: 10
Plan Prescribed Dose Per Fraction: 1.8 Gy
Plan Total Fractions Prescribed: 13
Plan Total Prescribed Dose: 23.4 Gy
Reference Point Dosage Given to Date: 18 Gy
Reference Point Session Dosage Given: 1.8 Gy
Session Number: 35

## 2023-08-12 ENCOUNTER — Other Ambulatory Visit: Payer: Self-pay

## 2023-08-12 ENCOUNTER — Ambulatory Visit
Admission: RE | Admit: 2023-08-12 | Discharge: 2023-08-12 | Disposition: A | Discharge status: Home / Self Care | Payer: Self-pay | Source: Ambulatory Visit | Attending: Radiation Oncology

## 2023-08-12 LAB — RAD ONC ARIA SESSION SUMMARY
Course Elapsed Days: 49
Plan Fractions Treated to Date: 11
Plan Prescribed Dose Per Fraction: 1.8 Gy
Plan Total Fractions Prescribed: 13
Plan Total Prescribed Dose: 23.4 Gy
Reference Point Dosage Given to Date: 19.8 Gy
Reference Point Session Dosage Given: 1.8 Gy
Session Number: 36

## 2023-08-13 ENCOUNTER — Other Ambulatory Visit: Payer: Self-pay

## 2023-08-13 ENCOUNTER — Ambulatory Visit
Admission: RE | Admit: 2023-08-13 | Discharge: 2023-08-13 | Disposition: A | Discharge status: Home / Self Care | Payer: Self-pay | Source: Ambulatory Visit | Attending: Radiation Oncology | Admitting: Radiation Oncology

## 2023-08-13 ENCOUNTER — Ambulatory Visit
Admission: RE | Admit: 2023-08-13 | Discharge: 2023-08-13 | Discharge status: Home / Self Care | Payer: Self-pay | Source: Ambulatory Visit | Attending: Radiation Oncology

## 2023-08-13 LAB — RAD ONC ARIA SESSION SUMMARY
Course Elapsed Days: 50
Plan Fractions Treated to Date: 12
Plan Prescribed Dose Per Fraction: 1.8 Gy
Plan Total Fractions Prescribed: 13
Plan Total Prescribed Dose: 23.4 Gy
Reference Point Dosage Given to Date: 21.6 Gy
Reference Point Session Dosage Given: 1.8 Gy
Session Number: 37

## 2023-08-14 ENCOUNTER — Ambulatory Visit
Admission: RE | Admit: 2023-08-14 | Discharge: 2023-08-14 | Disposition: A | Discharge status: Home / Self Care | Payer: Self-pay | Source: Ambulatory Visit | Attending: Radiation Oncology | Admitting: Radiation Oncology

## 2023-08-14 ENCOUNTER — Other Ambulatory Visit: Payer: Self-pay

## 2023-08-14 LAB — RAD ONC ARIA SESSION SUMMARY
Course Elapsed Days: 51
Plan Fractions Treated to Date: 13
Plan Prescribed Dose Per Fraction: 1.8 Gy
Plan Total Fractions Prescribed: 13
Plan Total Prescribed Dose: 23.4 Gy
Reference Point Dosage Given to Date: 23.4 Gy
Reference Point Session Dosage Given: 1.8 Gy
Session Number: 38

## 2023-08-14 NOTE — Progress Notes (Signed)
 Patient was a RadOnc Consult on 06/04/23 for his biochemically recurrent prostate cancer with a current PSA of 0.3, s/p RALP.  Patient proceed with treatment recommendations of 7.5-week course of daily salvage radiotherapy to the prostatic fossa and pelvic lymph nodes and had his final radiation treatment on 4/25.   Patient is scheduled for a post treatment nurse call on 09/15/23 and has on going follow up's with urology.  Patient received Eligard on 4/4 and will have MD follow up in September.

## 2023-08-17 ENCOUNTER — Ambulatory Visit: Payer: Self-pay

## 2023-08-17 NOTE — Radiation Completion Notes (Addendum)
  Radiation Oncology         [phone] ________________________________  Name: [name] MRN: [id number]  Date: 08/14/2023  DOB: [birth year]  Referring Physician: Doy Gene, M.D. Date of Service: 2023-08-17 Radiation Oncologist: Bartholome Ligas, M.D. [facility]     RADIATION ONCOLOGY END OF TREATMENT NOTE     Diagnosis: 78 year old man with biochemically recurrent prostate cancer with a current PSA of 0.3, s/p RALP in April 2023 for pT3a N0, Gleason 4+3 adenocarcinoma of the prostate.   Intent: Curative     ==========DELIVERED PLANS==========  First Treatment Date: 2023-06-24 Last Treatment Date: 2023-08-14   Plan Name: ProstBed_Pelv Site: Prostate Bed Technique: IMRT Mode: Photon Dose Per Fraction: 1.8 Gy Prescribed Dose (Delivered / Prescribed): 45 Gy / 45 Gy Prescribed Fxs (Delivered / Prescribed): 25 / 25   Plan Name: ProstBed_Bst Site: Prostate Bed Technique: IMRT Mode: Photon Dose Per Fraction: 1.8 Gy Prescribed Dose (Delivered / Prescribed): 23.4 Gy / 23.4 Gy Prescribed Fxs (Delivered / Prescribed): 13 / 13     ==========ON TREATMENT VISIT DATES========== 2023-06-26, 2023-07-03, 2023-07-10, 2023-07-16, 2023-07-24, 2023-07-31, 2023-08-07, 2023-08-13   See weekly On Treatment Notes in Epic for details in the Media tab (listed as Progress notes on the On Treatment Visit Dates listed above).  He tolerated the radiation treatments relatively well, with only modest fatigue and mild increased LUTS.  The patient will receive a call in about one month from the radiation oncology department. He will continue follow up with his urologist, Dr. Freddi Jaeger, as well.  ------------------------------------------------   Kenith Payer, MD [facility]  Radiation Oncology Direct Dial: [phone]  Fax: [phone] [email or web address]  Skype  LinkedIn

## 2023-08-17 NOTE — Progress Notes (Signed)
 [facility] CSW Progress Note  CSW forwarded email by patient's daughter. Patient's daughter stated waiting on response from CSW on financial assistance. CSW sent patients daughter email confirming [facility] Financial Assistance application submitted on 2/24. CSW provided billing number to follow up on status.   Maudie Sorrow, LCSW Clinical Social Worker [facility]

## 2023-08-18 ENCOUNTER — Ambulatory Visit: Payer: Self-pay

## 2023-09-15 ENCOUNTER — Ambulatory Visit
Admission: RE | Admit: 2023-09-15 | Discharge: 2023-09-15 | Disposition: A | Discharge status: Home / Self Care | Payer: Self-pay | Source: Ambulatory Visit | Attending: Internal Medicine | Admitting: Internal Medicine

## 2023-09-15 NOTE — Progress Notes (Signed)
  Radiation Oncology         [id number]) [phone] ________________________________  Name: [name] MRN: [id number]  Date of Service: 09/15/2023  DOB: 1945/09/27  Post Treatment Telephone Note  Diagnosis:  C61 Malignant neoplasm of prostate (as documented in provider EOT note)  Pre Treatment IPSS Score: 17 (as documented in the provider consult note)  The patient's daughter Ms. [name] was available for call today. Identity verified x2.  Symptoms of fatigue have improved since completing therapy.  Symptoms of bladder changes have improved since completing therapy. Current symptoms include mild polyuria, and medications for bladder symptoms include Tamsulosin .  Symptoms of bowel changes have improved since completing therapy. Current symptoms include none, and medications for bowel symptoms include none.   Post Treatment IPSS Score: IPSS Questionnaire (AUA-7): Over the past month.   1)  How often have you had a sensation of not emptying your bladder completely after you finish urinating?  1 - Less than 1 time in 5  2)  How often have you had to urinate again less than two hours after you finished urinating? 1 - Less than 1 time in 5  3)  How often have you found you stopped and started again several times when you urinated?  0 - Not at all  4) How difficult have you found it to postpone urination?  0 - Not at all  5) How often have you had a weak urinary stream?  0 - Not at all  6) How often have you had to push or strain to begin urination?  1 - Less than 1 time in 5  7) How many times did you most typically get up to urinate from the time you went to bed until the time you got up in the morning?  2 - 2 times  Total score:  5. Which indicates mild symptoms  0-7 mildly symptomatic   8-19 moderately symptomatic   20-35 severely symptomatic   Patient has a scheduled follow up visit with his urologist, Dr. Freddi Jaeger, on 09/2023 for ongoing surveillance. He was counseled that PSA levels  will be drawn in the urology office, and was reassured that additional time is expected to improve bowel and bladder symptoms. He was encouraged to call back with concerns or questions regarding radiation.   This concludes the interaction.  Avery Bodo, LPN

## 2023-10-22 ENCOUNTER — Telehealth: Payer: Self-pay | Admitting: Internal Medicine

## 2023-10-22 NOTE — Telephone Encounter (Signed)
 Patient's daughter states she is concern that patient is having memory loss,  Patient states she noticed when patient had covid in 2020 one of the side effects that he had after was some memory loss but daughter has noticed lately that patient is forgetting more stuff  Examples are if patient has eaten something patient later states he has never eaten that, Daughter states she has a Financial controller that goes every year to work at her house and patient has always talked to him but yesterday when he saw person patient stated he did not know him.  Daughter would like to know if there is something wrong or if is his age and if there is something he can take for memory loss.  Patient will be on wait list for an appointment.

## 2023-10-28 NOTE — Telephone Encounter (Signed)
 Called patient to offer appointment , patient did not take due to daughter who brings patient to visits will be working and will not be able to bring patient

## 2023-11-10 NOTE — Telephone Encounter (Signed)
 Called patient to offer appointment, patient did not take due to daughter bringing him to appointments will be working.

## 2023-11-11 NOTE — Telephone Encounter (Signed)
 Patient has been scheduled

## 2023-11-17 ENCOUNTER — Ambulatory Visit: Payer: Self-pay | Admitting: Internal Medicine

## 2023-11-17 ENCOUNTER — Encounter: Payer: Self-pay | Admitting: Internal Medicine

## 2023-11-17 VITALS — BP 128/70 | HR 70 | Ht 66.0 in | Wt 180.0 lb

## 2023-11-17 DIAGNOSIS — R413 Other amnesia: Secondary | ICD-10-CM

## 2023-11-17 DIAGNOSIS — I10 Essential (primary) hypertension: Secondary | ICD-10-CM

## 2023-11-17 MED ORDER — DONEPEZIL HCL 5 MG PO TABS: ORAL_TABLET | ORAL | 11 refills | Status: AC

## 2023-11-17 NOTE — Progress Notes (Signed)
 Subjective:    Patient ID: [name], male   DOB: 28-Jan-1946, 78 y.o.   MRN: [id number]   HPI  [name] interprets  Daughter gives some history.  She lives with him.  They are concerned about his memory.     Memory loss:  asking the same thing multiple times during the day.  Daughter has a handyman that comes to do work at the home and [name] did not recognize him.   Daughter states he seems to have issues with his health since COVID 2 years ago.   Father died in his 51s and no history of memory issues Mother died in her 43s without memory concerns. Sister who died in her 76s and no memory issues.    He notes he always forgets one or two items when he goes grocery shopping if he does not write a list and bring with him.   Feels he has had issue since ill with COVID, but has been more prominent in past 1 year.   He forgets he has taken food out of the fridge and leaves it out. He does not use any cooking items in the kitchen.    He does not drive--got rid of car during COVID.    He does walk regularly for 30-60 minutes, but has never felt lost.    He feels his treatment for prostate cancer may have contributed to memory loss, but daughter does not agree.    No urinary incontinence, No staggering gait.    Current Meds  Medication Sig   lisinopril -hydrochlorothiazide  (ZESTORETIC ) 20-12.5 MG tablet Take 1 tablet by mouth once daily     No Known Allergies   Review of Systems    Objective:   BP 128/70 (BP Location: Left Arm, Patient Position: Sitting, Cuff Size: Normal)   Pulse 70   Ht 5' 6 (1.676 m)   Wt 180 lb (81.6 kg)   BMI 29.05 kg/m   Physical Exam HENT:     Head: Normocephalic and atraumatic.     Right Ear: Tympanic membrane, ear canal and external ear normal.     Left Ear: Tympanic membrane, ear canal and external ear normal.     Nose: Nose normal.     Mouth/Throat:     Mouth: Mucous membranes are moist.     Pharynx: Oropharynx is  clear.  Eyes:     Extraocular Movements: Extraocular movements intact.     Conjunctiva/sclera: Conjunctivae normal.     Pupils: Pupils are equal, round, and reactive to light.     Comments: Discs sharp  Neck:     Thyroid: No thyroid mass or thyromegaly.  Cardiovascular:     Rate and Rhythm: Normal rate and regular rhythm.     Pulses: Normal pulses.     Heart sounds: Normal heart sounds. No murmur heard.    No friction rub.  Pulmonary:     Effort: Pulmonary effort is normal.     Breath sounds: Normal breath sounds.  Abdominal:     General: Bowel sounds are normal.     Palpations: Abdomen is soft. There is no mass.     Tenderness: There is no abdominal tenderness.     Hernia: No hernia is present.  Musculoskeletal:     Cervical back: Normal range of motion and neck supple.  Neurological:     General: No focal deficit present.     Mental Status: He is alert and oriented to person, place, and time.  Cranial Nerves: Cranial nerves 2-12 are intact.     Sensory: Sensation is intact.     Motor: Motor function is intact.     Coordination: Coordination is intact.     Gait: Gait is intact.     Deep Tendon Reflexes: Reflexes are normal and symmetric.       11/17/2023    5:08 PM 11/04/2022    4:34 PM  MMSE - Mini Mental State Exam  Orientation to time 5 5  Orientation to Place 4 5  Registration 3 3  Attention/ Calculation 4 4  Recall 0 0  Language- name 2 objects 2 2  Language- repeat 0 1  Language- follow 3 step command 3 3  Language- read & follow direction 1 1  Write a sentence 1 1  Copy design 1 1  Total score 24 26      Assessment & Plan   Mild decrease in memory with MMSE.  Scored 26 last year and now 24.  No findings in history or exam to suggest reversible issue.  Initiate Aricept  5 mg at bedtime.    2.  Hypertension:  controlled.  Spent 1 hour with patient face to face

## 2023-11-27 ENCOUNTER — Other Ambulatory Visit: Payer: Self-pay

## 2023-11-27 DIAGNOSIS — Z79899 Other long term (current) drug therapy: Secondary | ICD-10-CM

## 2023-11-27 DIAGNOSIS — E7800 Pure hypercholesterolemia, unspecified: Secondary | ICD-10-CM

## 2023-11-27 DIAGNOSIS — R7303 Prediabetes: Secondary | ICD-10-CM

## 2023-11-27 DIAGNOSIS — Z0000 Encounter for general adult medical examination without abnormal findings: Secondary | ICD-10-CM

## 2023-11-28 LAB — CBC WITH DIFFERENTIAL/PLATELET
Basophils Absolute: 0 x10E3/uL (ref 0.0–0.2)
Basos: 0 %
EOS (ABSOLUTE): 0.1 x10E3/uL (ref 0.0–0.4)
Eos: 2 %
Hematocrit: 40.9 % (ref 37.5–51.0)
Hemoglobin: 13.2 g/dL (ref 13.0–17.7)
Immature Grans (Abs): 0.1 x10E3/uL (ref 0.0–0.1)
Immature Granulocytes: 3 %
Lymphocytes Absolute: 0.6 x10E3/uL — ABNORMAL LOW (ref 0.7–3.1)
Lymphs: 19 %
MCH: 32.1 pg (ref 26.6–33.0)
MCHC: 32.3 g/dL (ref 31.5–35.7)
MCV: 100 fL — ABNORMAL HIGH (ref 79–97)
Monocytes Absolute: 0.4 x10E3/uL (ref 0.1–0.9)
Monocytes: 12 %
Neutrophils Absolute: 2.1 x10E3/uL (ref 1.4–7.0)
Neutrophils: 64 %
Platelets: 150 x10E3/uL (ref 150–450)
RBC: 4.11 x10E6/uL — ABNORMAL LOW (ref 4.14–5.80)
RDW: 14.2 % (ref 11.6–15.4)
WBC: 3.2 x10E3/uL — ABNORMAL LOW (ref 3.4–10.8)

## 2023-11-28 LAB — HEMOGLOBIN A1C
Est. average glucose Bld gHb Est-mCnc: 117 mg/dL
Hgb A1c MFr Bld: 5.7 % — ABNORMAL HIGH (ref 4.8–5.6)

## 2023-11-28 LAB — TSH: TSH: 2.79 u[IU]/mL (ref 0.450–4.500)

## 2023-11-28 LAB — COMPREHENSIVE METABOLIC PANEL WITH GFR
ALT: 30 IU/L (ref 0–44)
AST: 26 IU/L (ref 0–40)
Albumin: 4.8 g/dL (ref 3.8–4.8)
Alkaline Phosphatase: 79 IU/L (ref 44–121)
BUN/Creatinine Ratio: 19 (ref 10–24)
BUN: 19 mg/dL (ref 8–27)
Bilirubin Total: 0.6 mg/dL (ref 0.0–1.2)
CO2: 21 mmol/L (ref 20–29)
Calcium: 9.9 mg/dL (ref 8.6–10.2)
Chloride: 102 mmol/L (ref 96–106)
Creatinine, Ser: 0.99 mg/dL (ref 0.76–1.27)
Globulin, Total: 2.2 g/dL (ref 1.5–4.5)
Glucose: 99 mg/dL (ref 70–99)
Potassium: 4.1 mmol/L (ref 3.5–5.2)
Sodium: 139 mmol/L (ref 134–144)
Total Protein: 7 g/dL (ref 6.0–8.5)
eGFR: 79 mL/min/1.73 (ref >59)

## 2023-11-28 LAB — LIPID PANEL W/O CHOL/HDL RATIO
Cholesterol, Total: 224 mg/dL — ABNORMAL HIGH (ref 100–199)
HDL: 48 mg/dL (ref >39)
LDL Chol Calc (NIH): 141 mg/dL — ABNORMAL HIGH (ref 0–99)
Triglycerides: 194 mg/dL — ABNORMAL HIGH (ref 0–149)
VLDL Cholesterol Cal: 35 mg/dL (ref 5–40)

## 2023-12-04 ENCOUNTER — Ambulatory Visit: Payer: Self-pay | Admitting: Internal Medicine

## 2023-12-11 ENCOUNTER — Other Ambulatory Visit: Payer: Self-pay

## 2023-12-16 ENCOUNTER — Other Ambulatory Visit: Payer: Self-pay

## 2023-12-16 DIAGNOSIS — D7589 Other specified diseases of blood and blood-forming organs: Secondary | ICD-10-CM

## 2023-12-16 DIAGNOSIS — C61 Malignant neoplasm of prostate: Secondary | ICD-10-CM

## 2023-12-17 LAB — CBC WITH DIFFERENTIAL/PLATELET
Basophils Absolute: 0 x10E3/uL (ref 0.0–0.2)
Basos: 0 %
EOS (ABSOLUTE): 0.1 x10E3/uL (ref 0.0–0.4)
Eos: 2 %
Hematocrit: 39.5 % (ref 37.5–51.0)
Hemoglobin: 13.4 g/dL (ref 13.0–17.7)
Immature Grans (Abs): 0.2 x10E3/uL — ABNORMAL HIGH (ref 0.0–0.1)
Immature Granulocytes: 5 %
Lymphocytes Absolute: 0.8 x10E3/uL (ref 0.7–3.1)
Lymphs: 22 %
MCH: 33.8 pg — ABNORMAL HIGH (ref 26.6–33.0)
MCHC: 33.9 g/dL (ref 31.5–35.7)
MCV: 100 fL — ABNORMAL HIGH (ref 79–97)
Monocytes Absolute: 0.5 x10E3/uL (ref 0.1–0.9)
Monocytes: 15 %
Neutrophils Absolute: 1.9 x10E3/uL (ref 1.4–7.0)
Neutrophils: 56 %
Platelets: 159 x10E3/uL (ref 150–450)
RBC: 3.97 x10E6/uL — ABNORMAL LOW (ref 4.14–5.80)
RDW: 13.8 % (ref 11.6–15.4)
WBC: 3.5 x10E3/uL (ref 3.4–10.8)

## 2023-12-17 LAB — FOLATE: Folate: >20 ng/mL (ref >3.0)

## 2023-12-17 LAB — VITAMIN B12: Vitamin B-12: 763 pg/mL (ref 232–1245)

## 2023-12-19 LAB — SPECIMEN STATUS REPORT

## 2023-12-19 LAB — PSA: Prostate Specific Ag, Serum: <0.1 ng/mL (ref 0.0–4.0)

## 2023-12-24 ENCOUNTER — Ambulatory Visit: Payer: Self-pay | Admitting: Internal Medicine

## 2023-12-24 ENCOUNTER — Telehealth: Payer: Self-pay | Admitting: Internal Medicine

## 2023-12-24 NOTE — Telephone Encounter (Signed)
 Patient's daughter called and states patient would like to know lab results,

## 2023-12-25 ENCOUNTER — Other Ambulatory Visit: Payer: Self-pay

## 2024-01-26 ENCOUNTER — Telehealth: Payer: Self-pay | Admitting: Internal Medicine

## 2024-01-26 NOTE — Telephone Encounter (Signed)
 Patient's daughter called today and states that patient had an appointment today to get an Injection for cancer that he gets every six months,  Daughter received a call today from Urologist office and states she was told that patient's appointment will be cancel and they no longer will be able to continue care with him as they will not accept the orange card anymore since their policy has changed and appointments neither medications will be cover.   Daughter would like to know if patient can be referred to a different office .

## 2024-01-31 ENCOUNTER — Other Ambulatory Visit: Payer: Self-pay | Admitting: Internal Medicine

## 2024-02-03 NOTE — Telephone Encounter (Signed)
 I have not heard back from you about orange card and coverage of urology--any update?

## 2024-02-05 NOTE — Telephone Encounter (Signed)
 I emailed Holly from [facility] , asking about orange card coverage of urology she stated she was not aware of changes , she was to ask urology about situation but [facility] me back yesterday and stated that she has not gotten an update , she is continuing to follow up and will let us  know as soon as she gets an answer.

## 2024-02-24 ENCOUNTER — Ambulatory Visit: Payer: Self-pay | Admitting: Internal Medicine

## 2024-02-24 VITALS — BP 110/70 | HR 84 | Resp 20 | Ht 66.0 in | Wt 179.0 lb

## 2024-02-24 DIAGNOSIS — R051 Acute cough: Secondary | ICD-10-CM

## 2024-02-24 DIAGNOSIS — R413 Other amnesia: Secondary | ICD-10-CM

## 2024-02-24 DIAGNOSIS — Z23 Encounter for immunization: Secondary | ICD-10-CM

## 2024-02-24 MED ORDER — OXYBUTYNIN CHLORIDE 5 MG PO TABS: 5.0000 mg | ORAL_TABLET | Freq: Three times a day (TID) | ORAL | Status: AC

## 2024-02-24 NOTE — Progress Notes (Addendum)
 "    Subjective:    Patient ID: [name], male   DOB: 1946-02-29, 78 y.o.   MRN: [id number]   HPI   Memory concerns:  Has been on Aricept  5 mg at bedtime since April.  [name], with whom he lives, feels he is unchanged.   Other daughter with him on weekends:  [name]  2. Urinary frequency with little output, especially at night.  Was getting up every 1-2 hours a night.  After starting Oxybutynin , he is having improved output each time and urinating about every 3 hours at night.  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    3.  Prostate Cancer:  See phone call from 01/26/2024:  Patient and daughter were told they would not be able to continue care at [facility] when patient went in for injection ?Lupron? As they no longer take orange card.  We have yet to hear back from [facility] as to whether that is the case.  Current Meds  Medication Sig   donepezil  (ARICEPT ) 5 MG tablet 1 tab by mouth at bedtime   lisinopril -hydrochlorothiazide  (ZESTORETIC ) 20-12.5 MG tablet Take 1 tablet by mouth once daily  Also  Oxybutynin  unknown mg 3 times daily--not clear this is helping.   No Known Allergies   Review of Systems  Respiratory:  Positive for cough (improving from last week.  Using Dayquil/Nyquil).       Objective:   BP 110/70 (BP Location: Left Arm, Patient Position: Sitting, Cuff Size: Normal)   Pulse 84   Resp 20   Ht 5' 6 (1.676 m)   Wt 179 lb (81.2 kg)   BMI 28.89 kg/m   Physical Exam Constitutional:      Appearance: Normal appearance.     Comments: Mild hoarse cough  HENT:     Head: Normocephalic and atraumatic.     Right Ear: Tympanic membrane normal.     Left Ear: Tympanic membrane normal.     Nose: Nose normal.     Mouth/Throat:     Mouth: Mucous membranes are moist.     Pharynx: Oropharynx is clear.  Eyes:     Extraocular Movements: Extraocular movements intact.     Conjunctiva/sclera: Conjunctivae normal.     Pupils: Pupils are equal, round, and  reactive to light.  Cardiovascular:     Rate and Rhythm: Normal rate and regular rhythm.     Pulses: Normal pulses.     Heart sounds: Normal heart sounds. No murmur heard.    No friction rub.  Pulmonary:     Effort: Pulmonary effort is normal.     Breath sounds: Normal breath sounds.  Abdominal:     General: Bowel sounds are normal.     Palpations: Abdomen is soft.  Musculoskeletal:     Cervical back: Normal range of motion and neck supple.  Neurological:     Mental Status: He is alert.      Assessment & Plan   Prostate cancer:  Find out what injection he was receiving from urology and whether they will continue to follow him or not.  Previously followed by Dr. Selma.  Call made to [facility] and were looking into this, to contact patient/daughter.   2.  Dementia:  stable on Aricept   3.  URI:  supportive care.    4.  Urinary urgency:  continue Oxybutynin , not clear of dose, but added to his med list  5.  Isolation:  feels very isolated home alone when daughter at  work.  Will refer to [employer] to see if can set up with volunteer work or other activities outside of home that are safe.    6.  HM:  Fluad and Spikevax  vaccines today. "

## 2024-03-07 NOTE — Telephone Encounter (Incomplete)
 We took care of this at visit.  Urology still takes [facility]  It was a different issue

## 2024-05-04 ENCOUNTER — Telehealth: Payer: Self-pay | Admitting: Internal Medicine

## 2024-05-04 NOTE — Telephone Encounter (Signed)
 Per Dr. Adella patient needs to be seen.

## 2024-05-04 NOTE — Telephone Encounter (Signed)
 Patient's daughter call and states that patient needs a letter of evaluation for patient to take to the consulate of [place] as he is trying to do a process.   Daughter states doctor made this letter a year ago but patient was unable to do process due to him being sick at that time.   Now patient would like to get an updated letter to take to the consulate.  Patient needs letter before the end of this week.

## 2024-05-11 ENCOUNTER — Telehealth: Payer: Self-pay | Admitting: Internal Medicine

## 2024-05-11 NOTE — Telephone Encounter (Signed)
 Patient's daughter would like to know what happened with follow up with Urology and also with [facility].   Daughter states patient was suppose to get a call from [facility] to help with finding something to do.  Patient did not receive a call.

## 2024-05-12 ENCOUNTER — Ambulatory Visit: Payer: Self-pay | Admitting: Internal Medicine

## 2024-05-12 ENCOUNTER — Encounter: Payer: Self-pay | Admitting: Internal Medicine

## 2024-05-12 VITALS — BP 120/80 | HR 100 | Resp 22 | Ht 65.0 in | Wt 180.0 lb

## 2024-05-12 DIAGNOSIS — R9721 Rising PSA following treatment for malignant neoplasm of prostate: Secondary | ICD-10-CM

## 2024-05-12 DIAGNOSIS — R413 Other amnesia: Secondary | ICD-10-CM

## 2024-05-12 DIAGNOSIS — C61 Malignant neoplasm of prostate: Secondary | ICD-10-CM

## 2024-05-12 NOTE — Progress Notes (Signed)
 "   Subjective:    Patient ID: [name], male   DOB: May 21, 1945, 79 y.o.   MRN: [id number]   HPI  [name] interprets   Needs letter from [place]ulate again stating [name] is of sound mind.    He has developed mild memory issues.  See minor changes in MMSE below from 2024 to 10/2023.      He has a retirement fund in [place] and very clearly states he wants his daughter, [name], to prepare the paperwork or personally go to the consulate to get the money so he can pay his bills.  He requires a statement regarding his ability to make this decision and he is very clear he understands the situation and trusts his daughter, [name], to take care of this.  2.  Prostate CA:  Brought this up at visit in November and with phone calls back in [place] feel his care at [facility] with Dr. Selma was discontinued as the [facility] orange card did not cover.  I did have a conversation with [facility] staff that that was not the issue and thought there would be contact made to get him back in.   The family has not heard anything.  Called [facility] again today and spoke with Jonyiah.  Clear she does not understand how orange card works and unable to speak to someone else.  We have contacted [facility] again as to whether [facility] is working with orange card any longer and she is not certain.       11/17/2023    5:08 PM 11/04/2022    4:34 PM  MMSE - Mini Mental State Exam  Orientation to time 5 5  Orientation to Place 4 5  Registration 3 3  Attention/ Calculation 4 4  Recall 0 0  Language- name 2 objects 2 2  Language- repeat 0 1  Language- follow 3 step command 3 3  Language- read & follow direction 1 1  Write a sentence 1 1  Copy design 1 1  Total score 24 26     Current Meds  Medication Sig   donepezil  (ARICEPT ) 5 MG tablet 1 tab by mouth at bedtime   lisinopril -hydrochlorothiazide  (ZESTORETIC ) 20-12.5 MG tablet Take 1 tablet by mouth once daily   oxybutynin   (DITROPAN ) 5 MG tablet Take 1 tablet (5 mg total) by mouth 3 (three) times daily.   No Known Allergies   Review of Systems    Objective:   BP 120/80 (BP Location: Left Arm, Patient Position: Sitting, Cuff Size: Normal)   Pulse 100   Resp (!) 22   Ht 5' 5 (1.651 m)   Wt 180 lb (81.6 kg)   BMI 29.95 kg/m   Physical Exam Constitutional:      Appearance: Normal appearance.  HENT:     Head: Normocephalic and atraumatic.  Cardiovascular:     Rate and Rhythm: Normal rate and regular rhythm.     Pulses: Normal pulses.     Heart sounds: Normal heart sounds.  Pulmonary:     Effort: Pulmonary effort is normal.     Breath sounds: Normal breath sounds.  Neurological:     Mental Status: He is alert.       Assessment & Plan   Clear mental status today during discussion:  He is able to fluently describe what is needed with his retirement fund and that he needs for paying bills here.  Letter written to consulate  2.  Prostate Cancer:  Telephone:  Velia at [facility].  Not clear they will get back with a final answer and so recontacting [facility]/Holly to get a better idea.  May need to refer him for continued care to [facility]  3.  Isolation:  [name] has not heard anything from [employer] regarding ways to get her father out of home and active.  I re introduced the concern with LJ today and he will get back to them shortly.   "

## 2024-05-18 NOTE — Telephone Encounter (Signed)
 Patient was seen 05/12/2024.

## 2024-05-19 ENCOUNTER — Telehealth: Payer: Self-pay | Admitting: Internal Medicine

## 2024-05-19 DIAGNOSIS — C61 Malignant neoplasm of prostate: Secondary | ICD-10-CM

## 2024-05-19 NOTE — Telephone Encounter (Signed)
 Patient's daughter did called today and asked for an update.   I was waiting on [place] from [facility] to respond in regards Urology.    Holly from [facility] responded back and states she is still waiting for a response from urology as to see if they can still see patients with [facility].   Called patient's daughter and notified that Doctor will go ahead and refer patient to [facility] , notify patient's daughter patient will need to apply for financial assistance , notify we have copies at office if would like to stop by and get a copy or can obtain copy online. Daughter states she will stop by and pick up.   Daughter would like if referral has her number as primary as patient does not answer calls .

## 2024-05-20 DIAGNOSIS — C61 Malignant neoplasm of prostate: Secondary | ICD-10-CM | POA: Insufficient documentation

## 2024-05-20 NOTE — Telephone Encounter (Signed)
 Called [facility] office and spoke with  medical assistant Brittany,  She stated that based on notes , she read that the doctor asked for receptionist to notify patient that they are currently working on building orange card on their insurance system, as their new insurance system does not recognize the orange card, they are working on that, they notified patient that they will contact him back once that is working to schedule him an appointment.  MA asked for our office to contact her next week to see if she has an update on the process.   Phone number [phone] Ext: [phone]

## 2024-06-29 ENCOUNTER — Ambulatory Visit: Payer: Self-pay | Admitting: Internal Medicine
